# Patient Record
Sex: Male | Born: 1990 | Hispanic: Refuse to answer | Marital: Married | Smoking: Never smoker
Health system: Southern US, Community
[De-identification: ages and names within clinical notes are randomized; demographics above are authoritative.]

## PROBLEM LIST (undated history)

## (undated) DIAGNOSIS — J45909 Unspecified asthma, uncomplicated: Secondary | ICD-10-CM

## (undated) DIAGNOSIS — F419 Anxiety disorder, unspecified: Secondary | ICD-10-CM

## (undated) DIAGNOSIS — T7840XA Allergy, unspecified, initial encounter: Secondary | ICD-10-CM

## (undated) HISTORY — DX: Anxiety disorder, unspecified: F41.9

## (undated) HISTORY — DX: Allergy, unspecified, initial encounter: T78.40XA

## (undated) HISTORY — DX: Unspecified asthma, uncomplicated: J45.909

---
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
## 2014-10-01 HISTORY — PX: WRIST SURGERY: SHX841

## 2017-08-21 ENCOUNTER — Ambulatory Visit (INDEPENDENT_AMBULATORY_CARE_PROVIDER_SITE_OTHER): Payer: Managed Care, Other (non HMO) | Admitting: Family Medicine

## 2017-08-21 ENCOUNTER — Encounter: Payer: Self-pay | Admitting: Family Medicine

## 2017-08-21 ENCOUNTER — Other Ambulatory Visit: Payer: Self-pay

## 2017-08-21 VITALS — BP 112/72 | HR 82 | Temp 98.6°F | Resp 17 | Ht 69.0 in | Wt 181.4 lb

## 2017-08-21 DIAGNOSIS — Z7689 Persons encountering health services in other specified circumstances: Secondary | ICD-10-CM

## 2017-08-21 DIAGNOSIS — Z23 Encounter for immunization: Secondary | ICD-10-CM | POA: Diagnosis not present

## 2017-08-21 DIAGNOSIS — J4520 Mild intermittent asthma, uncomplicated: Secondary | ICD-10-CM

## 2017-08-21 NOTE — Progress Notes (Signed)
  Chief Complaint  Patient presents with  . 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 Allergy   . Anxiety   . Asthma     Current Outpatient Medications  Medication Sig Dispense Refill  . Ascorbic Acid (VITAMIN C) 1000 MG tablet Take 1,000 mg by mouth daily.    . cholecalciferol (VITAMIN D) 1000 units tablet Take 1,000 Units by mouth daily.    . vitamin B-12 (CYANOCOBALAMIN) 1000 MCG tablet Take 1,000 mcg by mouth daily.     No current facility-administered medications for this visit.     Allergies: No Known Allergies  Past Surgical History:  Procedure Laterality Date  . LITHOTRIPSY    . LITHOTRIPSY      Social History   Socioeconomic History  . Marital status: Married    Spouse name: None  . Number of children: None  . Years of education: None  . Highest education level: None  Social Needs  . Financial resource strain: None  . Food insecurity - worry: None  . Food insecurity - inability: None  . Transportation needs - medical: None  . Transportation needs - non-medical: None  Occupational History  . None  Tobacco Use  . Smoking status: Never Smoker  . Smokeless tobacco: Never Used  Substance and Sexual Activity  . Alcohol use: Yes    Alcohol/week: 4.2 - 6.0 oz    Types: 7 - 10 Cans of beer per week  . Drug use: No  . Sexual activity: Yes  Other Topics Concern  . None  Social History Narrative  . None    Family History  Problem Relation Age of Onset  . Heart disease Mother   . Asthma Father       ROS Review of Systems See HPI Constitution: No fevers or chills No malaise No diaphoresis Skin: No rash or itching Eyes: no blurry vision, no double vision GU: no dysuria or hematuria Neuro: no dizziness or headaches  all others reviewed and negative   Objective: Vitals:   08/21/17 1124  Resp: 17  SpO2: 99%  Weight: 181 lb 6.4 oz (82.3 kg)  Height: 5\' 9"  (1.753 m)    Physical Exam  Constitutional: He is oriented to person, place, and time. He appears well-developed and well-nourished.  HENT:  Head: Normocephalic and atraumatic.  Eyes: Conjunctivae and EOM are normal.  Cardiovascular: Normal rate, regular rhythm and normal heart sounds.  Pulmonary/Chest: Effort normal and breath sounds normal. No stridor. No respiratory distress.  Neurological: He is alert and oriented to person, place, and time.    Assessment and Plan Taylor Baker was seen today for new patient (initial visit).  Diagnoses and all orders for this visit:  Encounter to establish care with new doctor  Need for Tdap vaccination -     Tdap vaccine greater than or equal to 7yo IM  Mild intermittent reactive airway disease without complication- stable and asymptomatic     Taylor Baker A Creta LevinStallings

## 2017-08-21 NOTE — Patient Instructions (Addendum)
IF you received an x-ray today, you will receive an invoice from [facility]. Please contact [facility] at [phone] with questions or concerns regarding your invoice.   IF you received labwork today, you will receive an invoice from [facility]. Please contact [facility] at [phone] with questions or concerns regarding your invoice.   Our billing staff will not be able to assist you with questions regarding bills from these companies.  You will be contacted with the lab results as soon as they are available. The fastest way to get your results is to activate your My Chart account. Instructions are located on the last page of this paperwork. If you have not heard from [place] regarding the results in 2 weeks, please contact this office.    Immunization Schedule, Adult Recommended immunizations These include:  Influenza vaccine. ? All adults should be immunized every year. ? All adults, including pregnant women and people with hives-only allergy to eggs can receive the inactivated influenza vaccine (IIV) or recombinant influenza vaccine (RIV). ? Adults aged 18-64 years can receive the IIV or RIV. The RIV vaccine does not contain any egg protein. ? Adults aged 54 years or older can receive the high-dose or adjuvanted IIV.  Tetanus, diphtheria, and acellular pertussis (Td, Tdap) vaccine. ? Pregnant women should receive 1 dose of Tdap vaccine during each pregnancy. The dose should be obtained regardless of the length of time since the last dose. Immunization is preferred during the 27th to 36th week of gestation. ? An adult who has not previously received Tdap or who does not know his or her vaccine status should receive 1 dose of Tdap. This initial dose should be followed by tetanus and diphtheria toxoids (Td) booster doses every 10 years. ? Adults with an unknown or incomplete history of completing a 3-dose immunization series with Td-containing vaccines should begin or  complete a primary immunization series including a Tdap dose. The first 2 doses should be received at least 4 weeks apart, and the third dose 6-12 months after the second dose. ? Adults should receive a Td booster every 10 years.  Varicella vaccine. ? An adult without evidence of immunity to varicella should receive 2 doses 4-8 weeks apart, or a second dose if he or she has previously received 1 dose. ? Pregnant females who do not have evidence of immunity should receive the first dose after pregnancy. This first dose should be obtained before leaving the health care facility. The second dose should be obtained 4-8 weeks after the first dose. ? All healthcare workers should have evidence of immunity to varicella. ? Adults with cancer or those who are on therapy to suppress the immune system should not receive the varicella vaccine.  Human papillomavirus (HPV) vaccine. ? Females aged 13-26 years who have not received the vaccine previously should obtain the 3-dose series. Females should receive the second dose 1-2 months after the first dose, and the third dose 6 months after the first dose. ? The vaccine is not recommended for use in pregnant females. However, pregnancy testing is not needed before receiving a dose. If a male is found to be pregnant after receiving a dose, no treatment is needed. In that case, the remaining doses should be delayed until after the pregnancy. ? Males aged 77-21 years who have not received the vaccine previously should receive the 3-dose series. Males aged 22-26 years may also receive a 3 dose series. Males should receive the second dose 1-2 months after the  first dose, and the third dose 6 months after the first dose. ? Adult females up to age 26 years and adult males up to age 21 years who initiated the HPV vaccine series before age 15 years and received 2 doses at least 5 months apart do not need an additional dose of the vaccine. ? Adult females up to age 26 years  and adult male up to age 21 years who initiated the HPV vaccine series before 15 years but only received 1 dose or 2 doses less than 5 months apart should receive 1 additional dose of the vaccine. ? Immunization is recommended through the age of 26 years for any male who has sex with males and did not get any or all doses earlier. ? Immunization is recommended for any person with an immunocompromised condition through the age of 26 years if he or she did not get any or all doses earlier.  Zoster vaccine. ? One dose is recommended for adults aged 60 years or older unless certain conditions are present.  Measles, mumps, and rubella (MMR) vaccine. ? Adults born before 1957 generally are considered immune to measles and mumps. ? Adults born in 1957 or later should have 1 or more doses of MMR vaccine unless there is a contraindication to the vaccine or there is laboratory evidence of immunity to each of the three diseases. ? A routine second dose of MMR vaccine should be obtained at least 28 days after the first dose for students attending postsecondary schools, health care workers, or international travelers. ? People who received inactivated measles vaccine or an unknown type of measles vaccine during 1963-1967 should be revaccinated with 1 or 2 doses of MMR vaccine. ? People who received inactivated mumps vaccine or an unknown type of mumps vaccine before 1979 and are at high risk for mumps infection should consider immunization with 2 doses of MMR vaccine. ? For females of childbearing age, rubella immunity should be determined. If there is no evidence of immunity, females who are not pregnant should receive 1 dose of MMR. If there is no evidence of immunity, females who are pregnant should receive 1 dose of MMR after pregnancy and before leaving the healthcare facility. ? Unvaccinated health care workers born before 1957 who lack laboratory evidence of measles, mumps, or rubella immunity or laboratory  confirmation of disease should consider 2 doses of MMR 18 days apart for measles or mumps, or 1 dose of MMR for rubella.  Pneumococcal vaccines. ? All adults aged 65 years and older should receive 13-valent pneumococcal conjugate vaccine (PCV13) followed by 23-valent pneumococcal polyscaccharide vaccine (PPSV23) at least 1 year after PCV13. ? An adult aged 19 years or older who has certain medical conditions and has not been previously immunized should receive 1 dose of PCV13 vaccine. This PCV13 should be followed with a dose of pneumococcal polysaccharide (PPSV23) vaccine. The PPSV23 vaccine dose should be obtained at least 8 weeks after the dose of PCV13 vaccine. ? An adult aged 19 years or older who has certain medical conditions and previously received 1 or more doses of PPSV23 vaccine should receive 1 dose of PCV13. The PCV13 vaccine dose should be obtained 1 or more years after the last PPSV23 vaccine dose. ? PPSV23 vaccination should happen in all adults aged 19-64 who smoke cigarettes. ? People with an immunocompromised condition and certain other conditions should receive both PCV13 and PPSV23 vaccines. ? When indicated, people who have unknown immunization and have no record of   immunization should receive PPSV23 vaccine. ? People who received 1-2 doses of PPSV23 before age 65 years should receive another dose of PPSV23 vaccine at age 65 years or later if at least 5 years have passed since the previous dose. ? Doses of PPSV23 are not needed for people immunized with PPSV23 at or after age 65 years.  Meningococcal vaccine. ? Adults with asplenia or persistent complement component deficiencies should receive 2 doses of quadrivalent meningococcal conjugate (MenACWY-D) vaccine. The doses should be obtained at least 2 months apart. Revaccination should occur every 5 years. A 2-dose or 3-dose series of serogroup B meningococcal (MenB)vaccine should also be obtained. ? Microbiologists working with  certain meningococcal bacteria, military recruits, people at risk during an outbreak, and people who travel to or live in countries with a high rate of meningitis should be immunized. Revaccination is recommended every 5 years if the risk for infection remains. ? Adults with HIV infection who have not been previously vaccinated should receive a 2-dose MenACWY series with doses at least 2 months apart. If 1 dose was received previously, a second dose should be obtained at least 2 months later. Revaccination is recommended every 5 years. ? A first-year college student up through age 21 years who is living in a residence hall should receive a dose if he or she did not receive a dose on or after his or her 16th birthday. ? Adults aged 16-23 years may receive 2 doses of MenB vaccine for short-term protection against most strains of serogroup B meningococcal disease.  Hepatitis A vaccine. ? Adults who wish to be protected from this disease, have certain high-risk conditions, work with hepatitis A-infected animals, work in hepatitis A research labs, or travel to or work in countries with a high rate of hepatitis A should be immunized. ? Adults who were previously unvaccinated and who anticipate close contact with an international adoptee during the first 60 days after arrival in the [place] from a country with a high rate of hepatitis A should be immunized. ? The vaccine may be given as a 2 or 3-dose series by itself or in combination with the hepatitis B vaccine (HepB).  Hepatitis B vaccine. ? Adults who wish to be protected from this disease, may be exposed to blood or other infectious body fluids, are household contacts or sex partners of hepatitis B positive people, are clients or workers in certain care facilities, or travel to or work in countries with a high rate of hepatitis B should be immunized. ? Adults with chronic liver disease, such as hepatitis C infection, cirrhosis, fatty liver disease,  alcoholic liver disease, autoimmune hepatitis, and elevated liver chemistry levels should be vaccinated. ? Pregnant women who are at risk for hepatitis B virus infection during pregnancy should be immunized. ? The vaccine may be given as a 3-dose series by itself or in combination with the hepatitis A vaccine (HepA).  Haemophilus influenzae type b (Hib) vaccine. ? A previously unvaccinated person with asplenia or sickle cell disease or having a scheduled splenectomy should receive 1 dose of Hib vaccine. This should happen at least 14 days before the procedure. ? Regardless of previous immunization, a recipient of a hematopoietic stem cell transplant should receive a 3-dose series, with at least 4 weeks between doses, 6-12 months after his or her successful transplant. ? Hib vaccine is not recommended for adults with HIV infection.  This information is not intended to replace advice given to you by your health care   provider. Make sure you discuss any questions you have with your health care provider. Document Released: 12/08/2003 Document Revised: 05/30/2016 Document Reviewed: 05/30/2016 Elsevier Interactive Patient Education  2018 [employer].

## 2017-08-22 ENCOUNTER — Encounter: Payer: Self-pay | Admitting: Family Medicine

## 2017-08-29 ENCOUNTER — Encounter: Payer: Self-pay | Admitting: Family Medicine

## 2017-08-29 ENCOUNTER — Ambulatory Visit (INDEPENDENT_AMBULATORY_CARE_PROVIDER_SITE_OTHER): Payer: Managed Care, Other (non HMO) | Admitting: Family Medicine

## 2017-08-29 ENCOUNTER — Other Ambulatory Visit: Payer: Self-pay

## 2017-08-29 VITALS — BP 110/62 | HR 87 | Temp 98.3°F | Resp 17 | Ht 69.0 in | Wt 182.8 lb

## 2017-08-29 DIAGNOSIS — Z13220 Encounter for screening for lipoid disorders: Secondary | ICD-10-CM

## 2017-08-29 DIAGNOSIS — Z0000 Encounter for general adult medical examination without abnormal findings: Secondary | ICD-10-CM | POA: Diagnosis not present

## 2017-08-29 NOTE — Patient Instructions (Addendum)
   IF you received an x-ray today, you will receive an invoice from [facility]. Please contact [facility] at [phone] with questions or concerns regarding your invoice.   IF you received labwork today, you will receive an invoice from [facility]. Please contact [facility] at [phone] with questions or concerns regarding your invoice.   Our billing staff will not be able to assist you with questions regarding bills from these companies.  You will be contacted with the lab results as soon as they are available. The fastest way to get your results is to activate your My Chart account. Instructions are located on the last page of this paperwork. If you have not heard from us regarding the results in 2 weeks, please contact this office.      Health Maintenance, Male A healthy lifestyle and preventive care is important for your health and wellness. Ask your health care provider about what schedule of regular examinations is right for you. What should I know about weight and diet? Eat a Healthy Diet  Eat plenty of vegetables, fruits, whole grains, low-fat dairy products, and lean protein.  Do not eat a lot of foods high in solid fats, added sugars, or salt.  Maintain a Healthy Weight Regular exercise can help you achieve or maintain a healthy weight. You should:  Do at least 150 minutes of exercise each week. The exercise should increase your heart rate and make you sweat (moderate-intensity exercise).  Do strength-training exercises at least twice a week.  Watch Your Levels of Cholesterol and Blood Lipids  Have your blood tested for lipids and cholesterol every 5 years starting at 28 years of age. If you are at high risk for heart disease, you should start having your blood tested when you are 28 years old. You may need to have your cholesterol levels checked more often if: ? Your lipid or cholesterol levels are high. ? You are older than 28 years of age. ? You  are at high risk for heart disease.  What should I know about cancer screening? Many types of cancers can be detected early and may often be prevented. Lung Cancer  You should be screened every year for lung cancer if: ? You are a current smoker who has smoked for at least 30 years. ? You are a former smoker who has quit within the past 15 years.  Talk to your health care provider about your screening options, when you should start screening, and how often you should be screened.  Colorectal Cancer  Routine colorectal cancer screening usually begins at 28 years of age and should be repeated every 5-10 years until you are 28 years old. You may need to be screened more often if early forms of precancerous polyps or small growths are found. Your health care provider may recommend screening at an earlier age if you have risk factors for colon cancer.  Your health care provider may recommend using home test kits to check for hidden blood in the stool.  A small camera at the end of a tube can be used to examine your colon (sigmoidoscopy or colonoscopy). This checks for the earliest forms of colorectal cancer.  Prostate and Testicular Cancer  Depending on your age and overall health, your health care provider may do certain tests to screen for prostate and testicular cancer.  Talk to your health care provider about any symptoms or concerns you have about testicular or prostate cancer.  Skin Cancer  Check your skin   from head to toe regularly.  Tell your health care provider about any new moles or changes in moles, especially if: ? There is a change in a mole's size, shape, or color. ? You have a mole that is larger than a pencil eraser.  Always use sunscreen. Apply sunscreen liberally and repeat throughout the day.  Protect yourself by wearing long sleeves, pants, a wide-brimmed hat, and sunglasses when outside.  What should I know about heart disease, diabetes, and high blood  pressure?  If you are 18-39 years of age, have your blood pressure checked every 3-5 years. If you are 40 years of age or older, have your blood pressure checked every year. You should have your blood pressure measured twice-once when you are at a hospital or clinic, and once when you are not at a hospital or clinic. Record the average of the two measurements. To check your blood pressure when you are not at a hospital or clinic, you can use: ? An automated blood pressure machine at a pharmacy. ? A home blood pressure monitor.  Talk to your health care provider about your target blood pressure.  If you are between 45-79 years old, ask your health care provider if you should take aspirin to prevent heart disease.  Have regular diabetes screenings by checking your fasting blood sugar level. ? If you are at a normal weight and have a low risk for diabetes, have this test once every three years after the age of 45. ? If you are overweight and have a high risk for diabetes, consider being tested at a younger age or more often.  A one-time screening for abdominal aortic aneurysm (AAA) by ultrasound is recommended for men aged 65-75 years who are current or former smokers. What should I know about preventing infection? Hepatitis B If you have a higher risk for hepatitis B, you should be screened for this virus. Talk with your health care provider to find out if you are at risk for hepatitis B infection. Hepatitis C Blood testing is recommended for:  Everyone born from 1945 through 1965.  Anyone with known risk factors for hepatitis C.  Sexually Transmitted Diseases (STDs)  You should be screened each year for STDs including gonorrhea and chlamydia if: ? You are sexually active and are younger than 28 years of age. ? You are older than 28 years of age and your health care provider tells you that you are at risk for this type of infection. ? Your sexual activity has changed since you were last  screened and you are at an increased risk for chlamydia or gonorrhea. Ask your health care provider if you are at risk.  Talk with your health care provider about whether you are at high risk of being infected with HIV. Your health care provider may recommend a prescription medicine to help prevent HIV infection.  What else can I do?  Schedule regular health, dental, and eye exams.  Stay current with your vaccines (immunizations).  Do not use any tobacco products, such as cigarettes, chewing tobacco, and e-cigarettes. If you need help quitting, ask your health care provider.  Limit alcohol intake to no more than 2 drinks per day. One drink equals 12 ounces of beer, 5 ounces of wine, or 1 ounces of hard liquor.  Do not use street drugs.  Do not share needles.  Ask your health care provider for help if you need support or information about quitting drugs.  Tell your health care   provider if you often feel depressed.  Tell your health care provider if you have ever been abused or do not feel safe at home. This information is not intended to replace advice given to you by your health care provider. Make sure you discuss any questions you have with your health care provider. Document Released: 03/15/2008 Document Revised: 05/16/2016 Document Reviewed: 06/21/2015 Elsevier Interactive Patient Education  2018 [employer]  

## 2017-08-29 NOTE — Progress Notes (Signed)
Chief Complaint  Patient presents with  . Annual Exam    complete physical exam    Subjective:  Taylor Baker is a 26 y.o. male here for a health maintenance visit.  Patient is established pt  There are no active problems to display for this patient.   Past Medical History:  Diagnosis Date  . Allergy   . Anxiety   . Asthma     Past Surgical History:  Procedure Laterality Date  . LITHOTRIPSY    . LITHOTRIPSY       Outpatient Medications Prior to Visit  Medication Sig Dispense Refill  . Ascorbic Acid (VITAMIN C) 1000 MG tablet Take 1,000 mg by mouth daily.    . cholecalciferol (VITAMIN D) 1000 units tablet Take 1,000 Units by mouth daily.    . vitamin B-12 (CYANOCOBALAMIN) 1000 MCG tablet Take 1,000 mcg by mouth daily.     No facility-administered medications prior to visit.     No Known Allergies   Family History  Problem Relation Age of Onset  . Heart disease Mother   . Asthma Father      Health Habits: Dental Exam: up to date Eye Exam: up to date Exercise: 0 times/week on average Current exercise activities: walking/running Diet: good  Social History   Socioeconomic History  . Marital status: Married    Spouse name: Not on file  . Number of children: Not on file  . Years of education: Not on file  . Highest education level: Not on file  Social Needs  . Financial resource strain: Not hard at all  . Food insecurity - worry: Never true  . Food insecurity - inability: Never true  . Transportation needs - medical: No  . Transportation needs - non-medical: No  Occupational History  . Not on file  Tobacco Use  . Smoking status: Never Smoker  . Smokeless tobacco: Never Used  Substance and Sexual Activity  . Alcohol use: Yes    Alcohol/week: 4.2 - 6.0 oz    Types: 7 - 10 Cans of beer per week  . Drug use: No  . Sexual activity: Yes  Other Topics Concern  . Not on file  Social History Narrative  . Not on file   Social History    Substance and Sexual Activity  Alcohol Use Yes  . Alcohol/week: 4.2 - 6.0 oz  . Types: 7 - 10 Cans of beer per week   Social History   Tobacco Use  Smoking Status Never Smoker  Smokeless Tobacco Never Used   Social History   Substance and Sexual Activity  Drug Use No    Sexual Health  Sexually active: with male partner   Health Maintenance: See under health Maintenance activity for review of completion dates as well. Immunization History  Administered Date(s) Administered  . Tdap 08/21/2017      Depression Screen-PHQ2/9 Depression screen [facility] 2/[facility] 08/29/2017 08/21/2017  Decreased Interest 0 0  Down, Depressed, Hopeless 0 0  PHQ - 2 Score 0 0     Depression Severity and Treatment Recommendations:  0-4= None  5-9= Mild / Treatment: Support, educate to call if worse; return in one month  10-14= Moderate / Treatment: Support, watchful waiting; Antidepressant or Psycotherapy  15-19= Moderately severe / Treatment: Antidepressant OR Psychotherapy  >= 20 = Major depression, severe / Antidepressant AND Psychotherapy    Review of Systems   Review of Systems  Constitutional: Negative for chills, fever and weight loss.  Eyes: Negative for blurred  vision and double vision.  Respiratory: Negative for cough, shortness of breath and wheezing.   Cardiovascular: Negative for chest pain and palpitations.  Gastrointestinal: Negative for abdominal pain, nausea and vomiting.  Genitourinary: Negative for dysuria and urgency.  Skin: Negative for itching and rash.  Neurological: Negative for dizziness, tingling and headaches.  Psychiatric/Behavioral: Negative for depression. The patient is not nervous/anxious.     See HPI for ROS as well.    Objective:   Vitals:   08/29/17 0854  BP: 110/62  Pulse: 87  Resp: 17  Temp: 98.3 F (36.8 C)  TempSrc: Oral  SpO2: 99%  Weight: 182 lb 12.8 oz (82.9 kg)  Height: 5\' 9"  (1.753 m)    Body mass index is 26.99  kg/m.  Physical Exam  Constitutional: He is oriented to person, place, and time. He appears well-developed and well-nourished.  HENT:  Head: Normocephalic and atraumatic.  Right Ear: External ear normal.  Left Ear: External ear normal.  Nose: Nose normal.  Mouth/Throat: Oropharynx is clear and moist.  Eyes: Conjunctivae and EOM are normal. Right eye exhibits no discharge. Left eye exhibits no discharge.  Neck: Normal range of motion. Neck supple.  Cardiovascular: Normal rate, regular rhythm and normal heart sounds.  No murmur heard. Pulmonary/Chest: Effort normal. No respiratory distress. He has no wheezes.  Abdominal: Soft. Bowel sounds are normal. He exhibits no distension. There is no tenderness. There is no rebound and no guarding.  Musculoskeletal: Normal range of motion. He exhibits no edema.  Neurological: He is alert and oriented to person, place, and time. He has normal reflexes.  Skin: Skin is warm. No erythema.  Psychiatric: He has a normal mood and affect. His behavior is normal. Judgment and thought content normal.       Assessment/Plan:   Patient was seen for a health maintenance exam.  Counseled the patient on health maintenance issues. Reviewed her health mainteance schedule and ordered appropriate tests (see orders.) Counseled on regular exercise and weight management. Recommend regular eye exams and dental cleaning.   The following issues were addressed today for health maintenance:   Taylor Baker was seen today for annual exam.  Diagnoses and all orders for this visit:  Encounter for health maintenance examination in adult- discussed age appropriate  -     Lipid panel -     TSH -     Comprehensive metabolic panel -     Hemoglobin A1c  Screening, lipid- pt fasting     No Follow-up on file.    Body mass index is 26.99 kg/m.:  Discussed the patient's BMI with patient. The BMI body mass index is 26.99 kg/m.     No future appointments.  Patient  Instructions       IF you received an x-ray today, you will receive an invoice from [facility]. Please contact [facility] at [phone] with questions or concerns regarding your invoice.   IF you received labwork today, you will receive an invoice from [facility]. Please contact [facility] at [phone] with questions or concerns regarding your invoice.   Our billing staff will not be able to assist you with questions regarding bills from these companies.  You will be contacted with the lab results as soon as they are available. The fastest way to get your results is to activate your My Chart account. Instructions are located on the last page of this paperwork. If you have not heard from us regarding the results in 2 weeks, please contact this office.

## 2017-08-30 ENCOUNTER — Encounter: Payer: Self-pay | Admitting: Family Medicine

## 2017-08-30 LAB — LIPID PANEL
CHOLESTEROL TOTAL: 88 mg/dL — AB (ref 100–199)
Chol/HDL Ratio: 1.5 ratio (ref 0.0–5.0)
HDL: 58 mg/dL (ref >39)
LDL Calculated: 27 mg/dL (ref 0–99)
Triglycerides: 16 mg/dL (ref 0–149)
VLDL Cholesterol Cal: 3 mg/dL — ABNORMAL LOW (ref 5–40)

## 2017-08-30 LAB — COMPREHENSIVE METABOLIC PANEL
ALT: 23 IU/L (ref 0–44)
AST: 23 IU/L (ref 0–40)
Albumin/Globulin Ratio: 1.9 (ref 1.2–2.2)
Albumin: 5.2 g/dL (ref 3.5–5.5)
Alkaline Phosphatase: 64 IU/L (ref 39–117)
BILIRUBIN TOTAL: 0.8 mg/dL (ref 0.0–1.2)
BUN/Creatinine Ratio: 11 (ref 9–20)
BUN: 10 mg/dL (ref 6–20)
CHLORIDE: 100 mmol/L (ref 96–106)
CO2: 23 mmol/L (ref 20–29)
Calcium: 10.4 mg/dL — ABNORMAL HIGH (ref 8.7–10.2)
Creatinine, Ser: 0.92 mg/dL (ref 0.76–1.27)
GFR calc non Af Amer: 113 mL/min/{1.73_m2} (ref >59)
GFR, EST AFRICAN AMERICAN: 130 mL/min/{1.73_m2} (ref >59)
GLUCOSE: 100 mg/dL — AB (ref 65–99)
Globulin, Total: 2.7 g/dL (ref 1.5–4.5)
Potassium: 4 mmol/L (ref 3.5–5.2)
Sodium: 141 mmol/L (ref 134–144)
TOTAL PROTEIN: 7.9 g/dL (ref 6.0–8.5)

## 2017-08-30 LAB — TSH: TSH: 0.965 u[IU]/mL (ref 0.450–4.500)

## 2017-08-30 LAB — HEMOGLOBIN A1C
ESTIMATED AVERAGE GLUCOSE: 100 mg/dL
HEMOGLOBIN A1C: 5.1 % (ref 4.8–5.6)

## 2017-10-29 ENCOUNTER — Other Ambulatory Visit: Payer: Self-pay

## 2017-10-29 ENCOUNTER — Ambulatory Visit (INDEPENDENT_AMBULATORY_CARE_PROVIDER_SITE_OTHER): Payer: Managed Care, Other (non HMO) | Admitting: Family Medicine

## 2017-10-29 ENCOUNTER — Encounter: Payer: Self-pay | Admitting: Family Medicine

## 2017-10-29 VITALS — BP 122/78 | HR 68 | Temp 98.1°F | Resp 16 | Ht 69.29 in | Wt 192.0 lb

## 2017-10-29 DIAGNOSIS — J0100 Acute maxillary sinusitis, unspecified: Secondary | ICD-10-CM

## 2017-10-29 MED ORDER — IPRATROPIUM BROMIDE 0.03 % NA SOLN: 2.0000 | Freq: Four times a day (QID) | NASAL | 1 refills | Status: DC

## 2017-10-29 MED ORDER — AMOXICILLIN 500 MG PO CAPS: 1000.0000 mg | ORAL_CAPSULE | Freq: Two times a day (BID) | ORAL | 0 refills | Status: DC

## 2017-10-29 MED ORDER — PREDNISONE 20 MG PO TABS: ORAL_TABLET | ORAL | 0 refills | Status: DC

## 2017-10-29 NOTE — Patient Instructions (Addendum)
   IF you received an x-ray today, you will receive an invoice from [facility]. Please contact [facility] at [phone] with questions or concerns regarding your invoice.   IF you received labwork today, you will receive an invoice from [facility]. Please contact [facility] at [phone] with questions or concerns regarding your invoice.   Our billing staff will not be able to assist you with questions regarding bills from these companies.  You will be contacted with the lab results as soon as they are available. The fastest way to get your results is to activate your My Chart account. Instructions are located on the last page of this paperwork. If you have not heard from us regarding the results in 2 weeks, please contact this office.      Sinusitis, Adult Sinusitis is soreness and inflammation of your sinuses. Sinuses are hollow spaces in the bones around your face. Your sinuses are located:  Around your eyes.  In the middle of your forehead.  Behind your nose.  In your cheekbones.  Your sinuses and nasal passages are lined with a stringy fluid (mucus). Mucus normally drains out of your sinuses. When your nasal tissues become inflamed or swollen, the mucus can become trapped or blocked so air cannot flow through your sinuses. This allows bacteria, viruses, and funguses to grow, which leads to infection. Sinusitis can develop quickly and last for 7?10 days (acute) or for more than 12 weeks (chronic). Sinusitis often develops after a cold. What are the causes? This condition is caused by anything that creates swelling in the sinuses or stops mucus from draining, including:  Allergies.  Asthma.  Bacterial or viral infection.  Abnormally shaped bones between the nasal passages.  Nasal growths that contain mucus (nasal polyps).  Narrow sinus openings.  Pollutants, such as chemicals or irritants in the air.  A foreign object stuck in the nose.  A fungal  infection. This is rare.  What increases the risk? The following factors may make you more likely to develop this condition:  Having allergies or asthma.  Having had a recent cold or respiratory tract infection.  Having structural deformities or blockages in your nose or sinuses.  Having a weak immune system.  Doing a lot of swimming or diving.  Overusing nasal sprays.  Smoking.  What are the signs or symptoms? The main symptoms of this condition are pain and a feeling of pressure around the affected sinuses. Other symptoms include:  Upper toothache.  Earache.  Headache.  Bad breath.  Decreased sense of smell and taste.  A cough that may get worse at night.  Fatigue.  Fever.  Thick drainage from your nose. The drainage is often green and it may contain pus (purulent).  Stuffy nose or congestion.  Postnasal drip. This is when extra mucus collects in the throat or back of the nose.  Swelling and warmth over the affected sinuses.  Sore throat.  Sensitivity to light.  How is this diagnosed? This condition is diagnosed based on symptoms, a medical history, and a physical exam. To find out if your condition is acute or chronic, your health care provider may:  Look in your nose for signs of nasal polyps.  Tap over the affected sinus to check for signs of infection.  View the inside of your sinuses using an imaging device that has a light attached (endoscope).  If your health care provider suspects that you have chronic sinusitis, you may also:  Be tested for allergies.    Have a sample of mucus taken from your nose (nasal culture) and checked for bacteria.  Have a mucus sample examined to see if your sinusitis is related to an allergy.  If your sinusitis does not respond to treatment and it lasts longer than 8 weeks, you may have an MRI or CT scan to check your sinuses. These scans also help to determine how severe your infection is. In rare cases, a bone  biopsy may be done to rule out more serious types of fungal sinus disease. How is this treated? Treatment for sinusitis depends on the cause and whether your condition is chronic or acute. If a virus is causing your sinusitis, your symptoms will go away on their own within 10 days. You may be given medicines to relieve your symptoms, including:  Topical nasal decongestants. They shrink swollen nasal passages and let mucus drain from your sinuses.  Antihistamines. These drugs block inflammation that is triggered by allergies. This can help to ease swelling in your nose and sinuses.  Topical nasal corticosteroids. These are nasal sprays that ease inflammation and swelling in your nose and sinuses.  Nasal saline washes. These rinses can help to get rid of thick mucus in your nose.  If your condition is caused by bacteria, you will be given an antibiotic medicine. If your condition is caused by a fungus, you will be given an antifungal medicine. Surgery may be needed to correct underlying conditions, such as narrow nasal passages. Surgery may also be needed to remove polyps. Follow these instructions at home: Medicines  Take, use, or apply over-the-counter and prescription medicines only as told by your health care provider. These may include nasal sprays.  If you were prescribed an antibiotic medicine, take it as told by your health care provider. Do not stop taking the antibiotic even if you start to feel better. Hydrate and Humidify  Drink enough water to keep your urine clear or pale yellow. Staying hydrated will help to thin your mucus.  Use a cool mist humidifier to keep the humidity level in your home above 50%.  Inhale steam for 10-15 minutes, 3-4 times a day or as told by your health care provider. You can do this in the bathroom while a hot shower is running.  Limit your exposure to cool or dry air. Rest  Rest as much as possible.  Sleep with your head raised  (elevated).  Make sure to get enough sleep each night. General instructions  Apply a warm, moist washcloth to your face 3-4 times a day or as told by your health care provider. This will help with discomfort.  Wash your hands often with soap and water to reduce your exposure to viruses and other germs. If soap and water are not available, use hand sanitizer.  Do not smoke. Avoid being around people who are smoking (secondhand smoke).  Keep all follow-up visits as told by your health care provider. This is important. Contact a health care provider if:  You have a fever.  Your symptoms get worse.  Your symptoms do not improve within 10 days. Get help right away if:  You have a severe headache.  You have persistent vomiting.  You have pain or swelling around your face or eyes.  You have vision problems.  You develop confusion.  Your neck is stiff.  You have trouble breathing. This information is not intended to replace advice given to you by your health care provider. Make sure you discuss any questions you   have with your health care provider. Document Released: 09/17/2005 Document Revised: 05/13/2016 Document Reviewed: 07/13/2015 Elsevier Interactive Patient Education  2018 Elsevier Inc.  

## 2017-10-29 NOTE — Progress Notes (Signed)
Subjective:  By signing my name below, I, Essence Howell, attest that this documentation has been prepared under the direction and in the presence of Norberto SorensonEva Jamai Dolce, MD Electronically Signed: Charline BillsEssence Howell, ED Scribe 10/29/2017 at 1:43 PM.   Patient ID: Taylor Baker, male    DOB: 09/24/1991, 27 y.o.   MRN: [id number]  Chief Complaint  Patient presents with  . Sinus Problem    pt states he has been having head pressure  . Nasal Congestion    pt states the congestion has been since Sunday with some drainage    HPI Taylor Buffyhomas Tyler Dignan is a 27 y.o. male who presents to [facility] at [facility] complaining of scratchy throat and HA x 2 days ago. Pt reports gradually worsening throat irritation today which he describes as a "raw" sensation, nasal congestion and sinus pressure onset yesterday. He has tried DayQuil without relief. Denies fever, chills, generalized myalgias, nausea, vomiting. No known drug allergies. Has not been on antibiotics within the past few months.  There are no active problems to display for this patient.  Past Medical History:  Diagnosis Date  . Allergy   . Anxiety   . Asthma    Past Surgical History:  Procedure Laterality Date  . LITHOTRIPSY    . LITHOTRIPSY     No Known Allergies Prior to Admission medications   Medication Sig Start Date End Date Taking? Authorizing Provider  Ascorbic Acid (VITAMIN C) 1000 MG tablet Take 1,000 mg by mouth daily.    [provider]  cholecalciferol (VITAMIN D) 1000 units tablet Take 1,000 Units by mouth daily.    [provider]  vitamin B-12 (CYANOCOBALAMIN) 1000 MCG tablet Take 1,000 mcg by mouth daily.    [provider]   Social History   Socioeconomic History  . Marital status: Married    Spouse name: Not on file  . Number of children: Not on file  . Years of education: Not on file  . Highest education level: Not on file  Social Needs  . Financial resource strain: Not hard at all  .  Food insecurity - worry: Never true  . Food insecurity - inability: Never true  . Transportation needs - medical: No  . Transportation needs - non-medical: No  Occupational History  . Not on file  Tobacco Use  . Smoking status: Never Smoker  . Smokeless tobacco: Never Used  Substance and Sexual Activity  . Alcohol use: Yes    Alcohol/week: 4.2 - 6.0 oz    Types: 7 - 10 Cans of beer per week  . Drug use: No  . Sexual activity: Yes  Other Topics Concern  . Not on file  Social History Narrative  . Not on file   Depression screen [facility] 2/9 10/29/2017 08/29/2017 08/21/2017  Decreased Interest 0 0 0  Down, Depressed, Hopeless 0 0 0  PHQ - 2 Score 0 0 0    Review of Systems  Constitutional: Negative for chills and fever.  HENT: Positive for congestion, sinus pressure and sore throat.   Gastrointestinal: Negative for nausea and vomiting.  Musculoskeletal: Negative for myalgias.  Neurological: Positive for headaches.      Objective:   Physical Exam  Constitutional: He is oriented to person, place, and time. He appears well-developed and well-nourished. No distress.  HENT:  Head: Normocephalic and atraumatic.  Left Ear: Tympanic membrane is injected.  Mouth/Throat: Posterior oropharyngeal erythema (and clear postnasal drip) present.  L nare: rhinitis and edema  Eyes: Conjunctivae and EOM are normal.  Neck: Neck supple. No tracheal deviation present.  Cardiovascular: Normal rate and regular rhythm.  Pulmonary/Chest: Effort normal and breath sounds normal. No respiratory distress.  Musculoskeletal: Normal range of motion.  Lymphadenopathy:       Head (left side): Submandibular and tonsillar adenopathy present.    He has cervical adenopathy (on the L).  Neurological: He is alert and oriented to person, place, and time.  Skin: Skin is warm and dry.  Psychiatric: He has a normal mood and affect. His behavior is normal.  Nursing note and vitals reviewed.   BP 122/78   Pulse 68    Temp 98.1 F (36.7 C) (Oral)   Resp 16   Ht 5' 9.29" (1.76 m)   Wt 192 lb (87.1 kg)   SpO2 96%   BMI 28.12 kg/m     Assessment & Plan:   1. Acute non-recurrent maxillary sinusitis   Has several job interviews and a flight to [place] within the next few days so will add prednisone in to trx regiment with amox in hopes of expediting symptomatic improvement.  Meds ordered this encounter  Medications  . amoxicillin (AMOXIL) 500 MG capsule    Sig: Take 2 capsules (1,000 mg total) by mouth 2 (two) times daily.    Dispense:  40 capsule    Refill:  0  . ipratropium (ATROVENT) 0.03 % nasal spray    Sig: Place 2 sprays into the nose 4 (four) times daily.    Dispense:  30 mL    Refill:  1  . predniSONE (DELTASONE) 20 MG tablet    Sig: Take 3 tabs qd x 2d, then 2 tabs qd x 2d then 1 tab qd x 2d.    Dispense:  12 tablet    Refill:  0    I personally performed the services described in this documentation, which was scribed in my presence. The recorded information has been reviewed and considered, and addended by me as needed.   Norberto Sorenson, M.D.  Primary Care at [facility] [address] [place], [place] [address] [phone] phone [phone] fax  10/31/17 2:46 AM

## 2018-03-26 ENCOUNTER — Ambulatory Visit (INDEPENDENT_AMBULATORY_CARE_PROVIDER_SITE_OTHER): Payer: Managed Care, Other (non HMO) | Admitting: Physician Assistant

## 2018-03-26 ENCOUNTER — Other Ambulatory Visit: Payer: Self-pay

## 2018-03-26 ENCOUNTER — Encounter: Payer: Self-pay | Admitting: Physician Assistant

## 2018-03-26 VITALS — BP 116/84 | HR 67 | Temp 98.4°F | Ht 69.0 in | Wt 191.6 lb

## 2018-03-26 DIAGNOSIS — M25561 Pain in right knee: Secondary | ICD-10-CM | POA: Diagnosis not present

## 2018-03-26 DIAGNOSIS — M25562 Pain in left knee: Secondary | ICD-10-CM | POA: Diagnosis not present

## 2018-03-26 DIAGNOSIS — M7652 Patellar tendinitis, left knee: Secondary | ICD-10-CM | POA: Diagnosis not present

## 2018-03-26 DIAGNOSIS — M7651 Patellar tendinitis, right knee: Secondary | ICD-10-CM | POA: Diagnosis not present

## 2018-03-26 MED ORDER — MELOXICAM 7.5 MG PO TABS: 7.5000 mg | ORAL_TABLET | Freq: Every day | ORAL | 1 refills | Status: DC

## 2018-03-26 NOTE — Patient Instructions (Addendum)
  Meloxicam is an NSAID. Do not use with any other otc pain medication other than tylenol/acetaminophen - so no aleve, ibuprofen, motrin, advil, etc. You may take '15mg'$ /day -- you can take one pill in the morning and one at night if needed.   You will receive a phone call to schedule an appointment with sports medicine.   Please see attached sheets for patellar tendonitis info.   Thank you for coming in today. I hope you feel we met your needs.  Feel free to call PCP if you have any questions or further requests.  Please consider signing up for MyChart if you do not already have it, as this is a great way to communicate with me.  Best,  Whitney McVey, PA-C  IF you received an x-ray today, you will receive an invoice from [facility]. Please contact [facility] at [phone] with questions or concerns regarding your invoice.   IF you received labwork today, you will receive an invoice from [facility]. Please contact [facility] at [phone] with questions or concerns regarding your invoice.   Our billing staff will not be able to assist you with questions regarding bills from these companies.  You will be contacted with the lab results as soon as they are available. The fastest way to get your results is to activate your My Chart account. Instructions are located on the last page of this paperwork. If you have not heard from [place] regarding the results in 2 weeks, please contact this office.

## 2018-03-26 NOTE — Progress Notes (Signed)
   Taylor Baker  MRN: [id number] DOB: [birth year]  PCP: Patient, No Pcp Per  Subjective:  Pt is a 27 year old male who presents to clinic for knee pain x 1 week. Pain is of both knees, R>L.   He has been playing summer league basketball. This is the first time he's played sports in about 8 years.  Pain is worse every day. "today is horrible" hurt without ambulation. Pain is of the font of his knees.  Denies instability.  He has iced and wrapped knees.  No h/o knee pain.   Review of Systems  Constitutional: Negative for chills and fever.  Musculoskeletal: Positive for arthralgias (b/l knees). Negative for gait problem and joint swelling.    There are no active problems to display for this patient.   Current Outpatient Medications on File Prior to Visit  Medication Sig Dispense Refill  . Ascorbic Acid (VITAMIN C) 1000 MG tablet Take 1,000 mg by mouth daily.    . cholecalciferol (VITAMIN D) 1000 units tablet Take 1,000 Units by mouth daily.    . vitamin B-12 (CYANOCOBALAMIN) 1000 MCG tablet Take 1,000 mcg by mouth daily.    Marland Kitchen. ipratropium (ATROVENT) 0.03 % nasal spray Place 2 sprays into the nose 4 (four) times daily. (Patient not taking: Reported on 03/26/2018) 30 mL 1   No current facility-administered medications on file prior to visit.     No Known Allergies   Objective:  BP 116/84 (BP Location: Left Arm, Patient Position: Sitting, Cuff Size: Normal)   Pulse 67   Temp 98.4 F (36.9 C) (Oral)   Ht 5\' 9"  (1.753 m)   Wt 191 lb 9.6 oz (86.9 kg)   SpO2 99%   BMI 28.29 kg/m   Physical Exam  Constitutional: He is oriented to person, place, and time. He appears well-developed and well-nourished.  Musculoskeletal:       Right knee: He exhibits normal range of motion, no swelling, no effusion, normal alignment and normal patellar mobility. Tenderness found. Patellar tendon tenderness noted.       Left knee: He exhibits normal range of motion, no swelling, no effusion,  normal alignment and normal patellar mobility. Tenderness found. Patellar tendon tenderness noted.  Neurological: He is alert and oriented to person, place, and time.  Skin: Skin is warm and dry.  Psychiatric: He has a normal mood and affect. His behavior is normal. Judgment and thought content normal.  Vitals reviewed.   Assessment and Plan :  1. Patellar tendinitis of both knees 2. Acute pain of both knees - Pt presents c/o b/l knee pain after starting to play basketball for the first time in >5 years. Suspect b/l patellar tendonitis. B/l patellar straps placed on pt with moderate relief. Plan to refer to sports med for evaluation. Pt understands and agrees with plan.  - Ambulatory referral to Sports Medicine - meloxicam (MOBIC) 7.5 MG tablet; Take 1-2 tablets (7.5-15 mg total) by mouth daily.  Dispense: 30 tablet; Refill: 1  Whitney Deetra Booton, PA-C  Primary [facility] at [facility] 03/26/2018 4:19 PM

## 2018-03-27 ENCOUNTER — Ambulatory Visit (INDEPENDENT_AMBULATORY_CARE_PROVIDER_SITE_OTHER): Payer: Managed Care, Other (non HMO) | Admitting: Family Medicine

## 2018-03-27 ENCOUNTER — Encounter: Payer: Self-pay | Admitting: Family Medicine

## 2018-03-27 VITALS — BP 138/74 | HR 98 | Ht 69.0 in | Wt 197.0 lb

## 2018-03-27 DIAGNOSIS — M25561 Pain in right knee: Secondary | ICD-10-CM | POA: Diagnosis not present

## 2018-03-27 DIAGNOSIS — M25562 Pain in left knee: Secondary | ICD-10-CM

## 2018-03-27 NOTE — Patient Instructions (Signed)
Nice to meet you  Please try to ice the area  Please take the mobic 7 days straight and then as needed  Please try the exercises  Please try the straps if you're running  Please follow up in 2-3 weeks.

## 2018-03-27 NOTE — Progress Notes (Signed)
Taylor Baker - 27 y.o. male MRN [id number]  Date of birth: 1991-01-13  SUBJECTIVE:  Including CC & ROS.  Chief Complaint  Patient presents with  . Knee Pain    Taylor Baker is a 27 y.o. male that is presenting with bilateral knee pain. Ongoing for one week. Left is worse than right. Pain is located on the inferior aspect of his patella. Pain is worse with flexion and extension. Walking up stairs triggers mild to severe pain. Admits to swelling and tenderness. He was playing basketball one week ago and noticed the pain.  He started wearing a knee brace yesterday. Denies surgeries.    Review of Systems  Constitutional: Negative for fever.  HENT: Negative for congestion.   Respiratory: Negative for cough.   Cardiovascular: Negative for chest pain.  Gastrointestinal: Negative for abdominal distention.  Musculoskeletal: Negative for back pain.  Skin: Negative for color change.  Neurological: Negative for weakness.  Hematological: Negative for adenopathy.  Psychiatric/Behavioral: Negative for agitation.    HISTORY: Past Medical, Surgical, Social, and Family History Reviewed & Updated per EMR.   Pertinent Historical Findings include:  Past Medical History:  Diagnosis Date  . Allergy   . Anxiety   . Asthma     Past Surgical History:  Procedure Laterality Date  . LITHOTRIPSY  2003  . WRIST SURGERY Right 2016    No Known Allergies  Family History  Problem Relation Age of Onset  . Heart disease Mother   . Asthma Father      Social History   Socioeconomic History  . Marital status: Married    Spouse name: Not on file  . Number of children: Not on file  . Years of education: Not on file  . Highest education level: Not on file  Occupational History  . Not on file  Social Needs  . Financial resource strain: Not hard at all  . Food insecurity:    Worry: Never true    Inability: Never true  . Transportation needs:    Medical: No    Non-medical: No    Tobacco Use  . Smoking status: Never Smoker  . Smokeless tobacco: Never Used  Substance and Sexual Activity  . Alcohol use: Yes    Alcohol/week: 4.2 - 6.0 oz    Types: 7 - 10 Cans of beer per week  . Drug use: No  . Sexual activity: Yes  Lifestyle  . Physical activity:    Days per week: 0 days    Minutes per session: Not on file  . Stress: Not at all  Relationships  . Social connections:    Talks on phone: Not on file    Gets together: Not on file    Attends religious service: Not on file    Active member of club or organization: Not on file    Attends meetings of clubs or organizations: Not on file    Relationship status: Not on file  . Intimate partner violence:    Fear of current or ex partner: No    Emotionally abused: No    Physically abused: No    Forced sexual activity: No  Other Topics Concern  . Not on file  Social History Narrative  . Not on file     PHYSICAL EXAM:  VS: BP 138/74 (BP Location: Left Arm, Patient Position: Sitting, Cuff Size: Normal)   Pulse 98   Ht 5\' 9"  (1.753 m)   Wt 197 lb (89.4 kg)   SpO2  98%   BMI 29.09 kg/m  Physical Exam Gen: NAD, alert, cooperative with exam, well-appearing ENT: normal lips, normal nasal mucosa,  Eye: normal EOM, normal conjunctiva and lids CV:  no edema, +2 pedal pulses   Resp: no accessory muscle use, non-labored,  Skin: no rashes, no areas of induration  Neuro: normal tone, normal sensation to touch Psych:  normal insight, alert and oriented MSK:  Knee: Normal to inspection with no erythema or effusion or obvious bony abnormalities. Palpation normal with no warmth, joint line tenderness, patellar tenderness, or condyle tenderness. ROM full in flexion and extension and lower leg rotation. Ligaments with solid consistent endpoints including LCL, MCL. Negative Mcmurray's tests. Non painful patellar compression. Patellar glide without crepitus. Patellar and quadriceps tendons unremarkable. Hamstring and  quadriceps strength is normal.  Neurovascularly intact  Limited ultrasound: Left and right knee:  Left knee: No effusion suprapatellar pouch. Normal-appearing quadricep and patellar tendon. Normal appearing medial lateral meniscus. No increased vascular uptake of the patellar tendon  Right knee No effusion suprapatellar pouch. Normal-appearing quadricep and patellar tendon. Normal appearing medial lateral meniscus. No increased vascular uptake and patellar tendon  Summary: Normal exam  Ultrasound and interpretation by Clare Gandy, MD           ASSESSMENT & PLAN:   Acute pain of both knees Pain seems to be consistent with patellar tendinitis.  There does not appear to be any structural changes on ultrasound.   has been started on Mobic already. -Would advise to continue Mobic. -Counseled on home exercise therapy. -If no improvement will consider imaging, nitro patches versus physical therapy.

## 2018-03-28 DIAGNOSIS — M25562 Pain in left knee: Principal | ICD-10-CM

## 2018-03-28 DIAGNOSIS — M25561 Pain in right knee: Secondary | ICD-10-CM | POA: Insufficient documentation

## 2018-03-28 NOTE — Assessment & Plan Note (Signed)
Pain seems to be consistent with patellar tendinitis.  There does not appear to be any structural changes on ultrasound.   has been started on Mobic already. -Would advise to continue Mobic. -Counseled on home exercise therapy. -If no improvement will consider imaging, nitro patches versus physical therapy.

## 2018-04-17 ENCOUNTER — Ambulatory Visit: Payer: Managed Care, Other (non HMO) | Admitting: Family Medicine

## 2019-07-09 ENCOUNTER — Ambulatory Visit (INDEPENDENT_AMBULATORY_CARE_PROVIDER_SITE_OTHER): Payer: Managed Care, Other (non HMO) | Admitting: Emergency Medicine

## 2019-07-09 ENCOUNTER — Encounter: Payer: Self-pay | Admitting: Emergency Medicine

## 2019-07-09 ENCOUNTER — Emergency Department (HOSPITAL_COMMUNITY): Payer: Managed Care, Other (non HMO)

## 2019-07-09 ENCOUNTER — Encounter (HOSPITAL_COMMUNITY): Payer: Self-pay | Admitting: Emergency Medicine

## 2019-07-09 ENCOUNTER — Encounter (HOSPITAL_COMMUNITY)
Admission: EM | Disposition: A | Discharge status: Home / Self Care | Payer: Self-pay | Source: Home / Self Care | Attending: Emergency Medicine

## 2019-07-09 ENCOUNTER — Observation Stay (HOSPITAL_COMMUNITY)
Admission: EM | Admit: 2019-07-09 | Discharge: 2019-07-10 | Disposition: A | Discharge status: Home / Self Care | Payer: Managed Care, Other (non HMO) | Attending: General Surgery | Admitting: General Surgery

## 2019-07-09 ENCOUNTER — Ambulatory Visit (INDEPENDENT_AMBULATORY_CARE_PROVIDER_SITE_OTHER): Payer: Managed Care, Other (non HMO)

## 2019-07-09 ENCOUNTER — Observation Stay (HOSPITAL_COMMUNITY): Payer: Managed Care, Other (non HMO) | Admitting: Anesthesiology

## 2019-07-09 ENCOUNTER — Other Ambulatory Visit: Payer: Self-pay

## 2019-07-09 VITALS — BP 115/64 | HR 84 | Temp 98.3°F | Resp 16 | Ht 69.0 in | Wt 174.4 lb

## 2019-07-09 DIAGNOSIS — R1084 Generalized abdominal pain: Secondary | ICD-10-CM

## 2019-07-09 DIAGNOSIS — K353 Acute appendicitis with localized peritonitis, without perforation or gangrene: Secondary | ICD-10-CM

## 2019-07-09 DIAGNOSIS — M549 Dorsalgia, unspecified: Secondary | ICD-10-CM | POA: Diagnosis not present

## 2019-07-09 DIAGNOSIS — Z20828 Contact with and (suspected) exposure to other viral communicable diseases: Secondary | ICD-10-CM | POA: Insufficient documentation

## 2019-07-09 DIAGNOSIS — K3580 Unspecified acute appendicitis: Principal | ICD-10-CM | POA: Diagnosis present

## 2019-07-09 DIAGNOSIS — R109 Unspecified abdominal pain: Secondary | ICD-10-CM | POA: Diagnosis present

## 2019-07-09 HISTORY — PX: LAPAROSCOPIC APPENDECTOMY: SHX408

## 2019-07-09 LAB — POCT URINALYSIS DIP (MANUAL ENTRY)
Bilirubin, UA: NEGATIVE
Blood, UA: NEGATIVE
Glucose, UA: NEGATIVE mg/dL
Ketones, POC UA: NEGATIVE mg/dL
Leukocytes, UA: NEGATIVE
Nitrite, UA: NEGATIVE
Protein Ur, POC: NEGATIVE mg/dL
Spec Grav, UA: 1.01 (ref 1.010–1.025)
Urobilinogen, UA: 0.2 E.U./dL
pH, UA: 7 (ref 5.0–8.0)

## 2019-07-09 LAB — POCT CBC
Granulocyte percent: 76.8 %G (ref 37–80)
HCT, POC: 41.4 % — AB (ref 29–41)
Hemoglobin: 14.2 g/dL (ref 11–14.6)
Lymph, poc: 1.2 (ref 0.6–3.4)
MCH, POC: 30.5 pg (ref 27–31.2)
MCHC: 34.3 g/dL (ref 31.8–35.4)
MCV: 89 fL (ref 76–111)
MID (cbc): 0.2 (ref 0–0.9)
MPV: 7.7 fL (ref 0–99.8)
POC Granulocyte: 4.6 (ref 2–6.9)
POC LYMPH PERCENT: 19.9 %L (ref 10–50)
POC MID %: 3.3 %M (ref 0–12)
Platelet Count, POC: 295 10*3/uL (ref 142–424)
RBC: 4.65 M/uL — AB (ref 4.69–6.13)
RDW, POC: 13.4 %
WBC: 6 10*3/uL (ref 4.6–10.2)

## 2019-07-09 LAB — COMPREHENSIVE METABOLIC PANEL
ALT: 17 IU/L (ref 0–44)
ALT: 21 U/L (ref 0–44)
AST: 15 IU/L (ref 0–40)
AST: 17 U/L (ref 15–41)
Albumin/Globulin Ratio: 2.4 — ABNORMAL HIGH (ref 1.2–2.2)
Albumin: 5.1 g/dL (ref 4.1–5.2)
Albumin: 5.2 g/dL — ABNORMAL HIGH (ref 3.5–5.0)
Alkaline Phosphatase: 68 IU/L (ref 39–117)
Alkaline Phosphatase: 55 U/L (ref 38–126)
Anion gap: 10 (ref 5–15)
BUN/Creatinine Ratio: 7 — ABNORMAL LOW (ref 9–20)
BUN: 6 mg/dL (ref 6–20)
BUN: 6 mg/dL (ref 6–20)
Bilirubin Total: 0.5 mg/dL (ref 0.0–1.2)
CO2: 24 mmol/L (ref 20–29)
CO2: 25 mmol/L (ref 22–32)
Calcium: 9.9 mg/dL (ref 8.7–10.2)
Calcium: 9.6 mg/dL (ref 8.9–10.3)
Chloride: 101 mmol/L (ref 96–106)
Chloride: 105 mmol/L (ref 98–111)
Creatinine, Ser: 0.9 mg/dL (ref 0.76–1.27)
Creatinine, Ser: 0.8 mg/dL (ref 0.61–1.24)
GFR calc Af Amer: 132 mL/min/{1.73_m2} (ref >59)
GFR calc Af Amer: >60 mL/min (ref >60)
GFR calc non Af Amer: 114 mL/min/{1.73_m2} (ref >59)
GFR calc non Af Amer: >60 mL/min (ref >60)
Globulin, Total: 2.1 g/dL (ref 1.5–4.5)
Glucose, Bld: 103 mg/dL — ABNORMAL HIGH (ref 70–99)
Glucose: 94 mg/dL (ref 65–99)
Potassium: 4.2 mmol/L (ref 3.5–5.2)
Potassium: 3.9 mmol/L (ref 3.5–5.1)
Sodium: 140 mmol/L (ref 134–144)
Sodium: 140 mmol/L (ref 135–145)
Total Bilirubin: 0.6 mg/dL (ref 0.3–1.2)
Total Protein: 7.2 g/dL (ref 6.0–8.5)
Total Protein: 7.9 g/dL (ref 6.5–8.1)

## 2019-07-09 LAB — CBC WITH DIFFERENTIAL/PLATELET
Abs Immature Granulocytes: 0.02 10*3/uL (ref 0.00–0.07)
Basophils Absolute: 0 10*3/uL (ref 0.0–0.1)
Basophils Relative: 1 %
Eosinophils Absolute: 0.1 10*3/uL (ref 0.0–0.5)
Eosinophils Relative: 2 %
HCT: 42.1 % (ref 39.0–52.0)
Hemoglobin: 13.8 g/dL (ref 13.0–17.0)
Immature Granulocytes: 0 %
Lymphocytes Relative: 23 %
Lymphs Abs: 1.6 10*3/uL (ref 0.7–4.0)
MCH: 29.9 pg (ref 26.0–34.0)
MCHC: 32.8 g/dL (ref 30.0–36.0)
MCV: 91.1 fL (ref 80.0–100.0)
Monocytes Absolute: 0.6 10*3/uL (ref 0.1–1.0)
Monocytes Relative: 8 %
Neutro Abs: 4.8 10*3/uL (ref 1.7–7.7)
Neutrophils Relative %: 66 %
Platelets: 293 10*3/uL (ref 150–400)
RBC: 4.62 MIL/uL (ref 4.22–5.81)
RDW: 13.1 % (ref 11.5–15.5)
WBC: 7.2 10*3/uL (ref 4.0–10.5)
nRBC: 0 % (ref 0.0–0.2)

## 2019-07-09 LAB — SURGICAL PCR SCREEN
MRSA, PCR: NEGATIVE
Staphylococcus aureus: NEGATIVE

## 2019-07-09 LAB — SARS CORONAVIRUS 2 BY RT PCR (HOSPITAL ORDER, PERFORMED IN ~~LOC~~ HOSPITAL LAB): SARS Coronavirus 2: NEGATIVE

## 2019-07-09 LAB — LIPASE, BLOOD: Lipase: 29 U/L (ref 11–51)

## 2019-07-09 SURGERY — APPENDECTOMY, LAPAROSCOPIC
Anesthesia: General

## 2019-07-09 MED ORDER — SODIUM CHLORIDE 0.9 % IV BOLUS
1000.0000 mL | Freq: Once | INTRAVENOUS | Status: AC
  Administered 2019-07-09: 1000 mL via INTRAVENOUS

## 2019-07-09 MED ORDER — SIMETHICONE 80 MG PO CHEW: 40.0000 mg | CHEWABLE_TABLET | Freq: Four times a day (QID) | ORAL | Status: DC | PRN

## 2019-07-09 MED ORDER — DEXAMETHASONE SODIUM PHOSPHATE 10 MG/ML IJ SOLN
INTRAMUSCULAR | Status: AC
  Filled 2019-07-09: qty 1

## 2019-07-09 MED ORDER — OXYCODONE HCL 5 MG/5ML PO SOLN: 5.0000 mg | Freq: Once | ORAL | Status: AC | PRN

## 2019-07-09 MED ORDER — MIDAZOLAM HCL 5 MG/5ML IJ SOLN
INTRAMUSCULAR | Status: DC | PRN
  Administered 2019-07-09: 2 mg via INTRAVENOUS

## 2019-07-09 MED ORDER — MORPHINE SULFATE (PF) 2 MG/ML IV SOLN: 2.0000 mg | INTRAVENOUS | Status: DC | PRN

## 2019-07-09 MED ORDER — SODIUM CHLORIDE 0.9 % IV SOLN
INTRAVENOUS | Status: DC
  Administered 2019-07-09: 18:00:00 via INTRAVENOUS

## 2019-07-09 MED ORDER — SUCCINYLCHOLINE CHLORIDE 20 MG/ML IJ SOLN
INTRAMUSCULAR | Status: DC | PRN
  Administered 2019-07-09: 120 mg via INTRAVENOUS

## 2019-07-09 MED ORDER — LIDOCAINE 2% (20 MG/ML) 5 ML SYRINGE
INTRAMUSCULAR | Status: AC
  Filled 2019-07-09: qty 5

## 2019-07-09 MED ORDER — ONDANSETRON HCL 4 MG/2ML IJ SOLN: 4.0000 mg | Freq: Once | INTRAMUSCULAR | Status: DC | PRN

## 2019-07-09 MED ORDER — DOCUSATE SODIUM 100 MG PO CAPS
100.0000 mg | ORAL_CAPSULE | Freq: Two times a day (BID) | ORAL | Status: DC
  Administered 2019-07-09 (×2): 100 mg via ORAL
  Filled 2019-07-09 (×2): qty 1

## 2019-07-09 MED ORDER — ONDANSETRON HCL 4 MG/2ML IJ SOLN
INTRAMUSCULAR | Status: AC
  Filled 2019-07-09: qty 2

## 2019-07-09 MED ORDER — BUPIVACAINE HCL (PF) 0.25 % IJ SOLN
INTRAMUSCULAR | Status: AC
  Filled 2019-07-09: qty 30

## 2019-07-09 MED ORDER — MEPERIDINE HCL 50 MG/ML IJ SOLN
INTRAMUSCULAR | Status: AC
  Filled 2019-07-09: qty 1

## 2019-07-09 MED ORDER — SODIUM CHLORIDE (PF) 0.9 % IJ SOLN
INTRAMUSCULAR | Status: AC
  Filled 2019-07-09: qty 50

## 2019-07-09 MED ORDER — METRONIDAZOLE IN NACL 5-0.79 MG/ML-% IV SOLN: 500.0000 mg | INTRAVENOUS | Status: DC

## 2019-07-09 MED ORDER — SODIUM CHLORIDE 0.9 % IV SOLN
2.0000 g | Freq: Once | INTRAVENOUS | Status: AC
  Administered 2019-07-09: 2 g via INTRAVENOUS
  Filled 2019-07-09: qty 20

## 2019-07-09 MED ORDER — FENTANYL CITRATE (PF) 100 MCG/2ML IJ SOLN
25.0000 ug | INTRAMUSCULAR | Status: DC | PRN
  Administered 2019-07-09 (×2): 50 ug via INTRAVENOUS

## 2019-07-09 MED ORDER — ONDANSETRON HCL 4 MG/2ML IJ SOLN: 4.0000 mg | Freq: Four times a day (QID) | INTRAMUSCULAR | Status: DC | PRN

## 2019-07-09 MED ORDER — MIDAZOLAM HCL 2 MG/2ML IJ SOLN
INTRAMUSCULAR | Status: AC
  Filled 2019-07-09: qty 2

## 2019-07-09 MED ORDER — BUPIVACAINE HCL (PF) 0.25 % IJ SOLN
INTRAMUSCULAR | Status: DC | PRN
  Administered 2019-07-09: 20 mL

## 2019-07-09 MED ORDER — MEPERIDINE HCL 50 MG/ML IJ SOLN
6.2500 mg | INTRAMUSCULAR | Status: DC | PRN
  Administered 2019-07-09 (×2): 6.25 mg via INTRAVENOUS

## 2019-07-09 MED ORDER — PANTOPRAZOLE SODIUM 40 MG IV SOLR
40.0000 mg | Freq: Every day | INTRAVENOUS | Status: DC
  Administered 2019-07-09: 40 mg via INTRAVENOUS
  Filled 2019-07-09: qty 40

## 2019-07-09 MED ORDER — OXYCODONE HCL 5 MG PO TABS
5.0000 mg | ORAL_TABLET | Freq: Once | ORAL | Status: AC | PRN
  Administered 2019-07-09: 5 mg via ORAL

## 2019-07-09 MED ORDER — DIPHENHYDRAMINE HCL 25 MG PO CAPS: 25.0000 mg | ORAL_CAPSULE | Freq: Four times a day (QID) | ORAL | Status: DC | PRN

## 2019-07-09 MED ORDER — IOHEXOL 300 MG/ML  SOLN
100.0000 mL | Freq: Once | INTRAMUSCULAR | Status: AC | PRN
  Administered 2019-07-09: 100 mL via INTRAVENOUS

## 2019-07-09 MED ORDER — MUPIROCIN 2 % EX OINT: 1.0000 "application " | TOPICAL_OINTMENT | Freq: Two times a day (BID) | CUTANEOUS | Status: DC

## 2019-07-09 MED ORDER — ONDANSETRON 4 MG PO TBDP: 4.0000 mg | ORAL_TABLET | Freq: Four times a day (QID) | ORAL | Status: DC | PRN

## 2019-07-09 MED ORDER — ACETAMINOPHEN 325 MG PO TABS: 325.0000 mg | ORAL_TABLET | ORAL | Status: DC | PRN

## 2019-07-09 MED ORDER — OXYCODONE HCL 5 MG PO TABS
ORAL_TABLET | ORAL | Status: AC
  Filled 2019-07-09: qty 1

## 2019-07-09 MED ORDER — FENTANYL CITRATE (PF) 100 MCG/2ML IJ SOLN
INTRAMUSCULAR | Status: AC
  Filled 2019-07-09: qty 2

## 2019-07-09 MED ORDER — LIDOCAINE 2% (20 MG/ML) 5 ML SYRINGE
INTRAMUSCULAR | Status: DC | PRN
  Administered 2019-07-09: 80 mg via INTRAVENOUS

## 2019-07-09 MED ORDER — OXYCODONE HCL 5 MG PO TABS
5.0000 mg | ORAL_TABLET | ORAL | Status: DC | PRN
  Administered 2019-07-09 – 2019-07-10 (×2): 5 mg via ORAL
  Filled 2019-07-09 (×2): qty 1

## 2019-07-09 MED ORDER — FENTANYL CITRATE (PF) 250 MCG/5ML IJ SOLN
INTRAMUSCULAR | Status: AC
  Filled 2019-07-09: qty 5

## 2019-07-09 MED ORDER — ACETAMINOPHEN 650 MG RE SUPP: 650.0000 mg | Freq: Four times a day (QID) | RECTAL | Status: DC | PRN

## 2019-07-09 MED ORDER — ONDANSETRON HCL 4 MG/2ML IJ SOLN
INTRAMUSCULAR | Status: DC | PRN
  Administered 2019-07-09: 4 mg via INTRAVENOUS

## 2019-07-09 MED ORDER — ACETAMINOPHEN 160 MG/5ML PO SOLN: 325.0000 mg | ORAL | Status: DC | PRN

## 2019-07-09 MED ORDER — ROCURONIUM BROMIDE 10 MG/ML (PF) SYRINGE
PREFILLED_SYRINGE | INTRAVENOUS | Status: DC | PRN
  Administered 2019-07-09: 40 mg via INTRAVENOUS

## 2019-07-09 MED ORDER — SODIUM CHLORIDE 0.9 % IV SOLN: 2.0000 g | INTRAVENOUS | Status: DC

## 2019-07-09 MED ORDER — SODIUM CHLORIDE 0.9 % IR SOLN
Status: DC | PRN
  Administered 2019-07-09: 1000 mL

## 2019-07-09 MED ORDER — HYDROMORPHONE HCL 1 MG/ML IJ SOLN
INTRAMUSCULAR | Status: AC
  Filled 2019-07-09: qty 1

## 2019-07-09 MED ORDER — PROPOFOL 10 MG/ML IV BOLUS
INTRAVENOUS | Status: DC | PRN
  Administered 2019-07-09: 200 mg via INTRAVENOUS

## 2019-07-09 MED ORDER — POLYETHYLENE GLYCOL 3350 17 G PO PACK: 17.0000 g | PACK | Freq: Every day | ORAL | Status: DC | PRN

## 2019-07-09 MED ORDER — MORPHINE SULFATE (PF) 4 MG/ML IV SOLN
4.0000 mg | Freq: Once | INTRAVENOUS | Status: AC
  Administered 2019-07-09: 4 mg via INTRAVENOUS
  Filled 2019-07-09: qty 1

## 2019-07-09 MED ORDER — DEXAMETHASONE SODIUM PHOSPHATE 10 MG/ML IJ SOLN
INTRAMUSCULAR | Status: DC | PRN
  Administered 2019-07-09: 5 mg via INTRAVENOUS

## 2019-07-09 MED ORDER — PROPOFOL 10 MG/ML IV BOLUS
INTRAVENOUS | Status: AC
  Filled 2019-07-09: qty 20

## 2019-07-09 MED ORDER — SUGAMMADEX SODIUM 200 MG/2ML IV SOLN
INTRAVENOUS | Status: DC | PRN
  Administered 2019-07-09: 200 mg via INTRAVENOUS

## 2019-07-09 MED ORDER — ONDANSETRON HCL 4 MG/2ML IJ SOLN
4.0000 mg | Freq: Once | INTRAMUSCULAR | Status: AC
  Administered 2019-07-09: 15:00:00 4 mg via INTRAVENOUS
  Filled 2019-07-09: qty 2

## 2019-07-09 MED ORDER — DIPHENHYDRAMINE HCL 50 MG/ML IJ SOLN: 25.0000 mg | Freq: Four times a day (QID) | INTRAMUSCULAR | Status: DC | PRN

## 2019-07-09 MED ORDER — METOPROLOL TARTRATE 5 MG/5ML IV SOLN: 5.0000 mg | Freq: Four times a day (QID) | INTRAVENOUS | Status: DC | PRN

## 2019-07-09 MED ORDER — LACTATED RINGERS IV SOLN
INTRAVENOUS | Status: DC | PRN
  Administered 2019-07-09 (×2): via INTRAVENOUS

## 2019-07-09 MED ORDER — ROCURONIUM BROMIDE 10 MG/ML (PF) SYRINGE
PREFILLED_SYRINGE | INTRAVENOUS | Status: AC
  Filled 2019-07-09: qty 10

## 2019-07-09 MED ORDER — ACETAMINOPHEN 325 MG PO TABS: 650.0000 mg | ORAL_TABLET | Freq: Four times a day (QID) | ORAL | Status: DC | PRN

## 2019-07-09 MED ORDER — METHOCARBAMOL 500 MG PO TABS
500.0000 mg | ORAL_TABLET | Freq: Four times a day (QID) | ORAL | Status: DC | PRN
  Administered 2019-07-09: 500 mg via ORAL
  Filled 2019-07-09: qty 1

## 2019-07-09 MED ORDER — METRONIDAZOLE IN NACL 5-0.79 MG/ML-% IV SOLN
500.0000 mg | Freq: Once | INTRAVENOUS | Status: AC
  Administered 2019-07-09: 500 mg via INTRAVENOUS
  Filled 2019-07-09: qty 100

## 2019-07-09 MED ORDER — FENTANYL CITRATE (PF) 100 MCG/2ML IJ SOLN
INTRAMUSCULAR | Status: DC | PRN
  Administered 2019-07-09 (×2): 100 ug via INTRAVENOUS
  Administered 2019-07-09: 50 ug via INTRAVENOUS

## 2019-07-09 SURGICAL SUPPLY — 32 items
APPLIER CLIP ROT 10 11.4 M/L (STAPLE) ×3
CABLE HIGH FREQUENCY MONO STRZ (ELECTRODE) ×3 IMPLANT
CHLORAPREP W/TINT 26 (MISCELLANEOUS) ×3 IMPLANT
CLIP APPLIE ROT 10 11.4 M/L (STAPLE) IMPLANT
COVER WAND RF STERILE (DRAPES) IMPLANT
CUTTER FLEX LINEAR 45M (STAPLE) ×2 IMPLANT
DECANTER SPIKE VIAL GLASS SM (MISCELLANEOUS) ×3 IMPLANT
DERMABOND ADVANCED (GAUZE/BANDAGES/DRESSINGS) ×2
DERMABOND ADVANCED .7 DNX12 (GAUZE/BANDAGES/DRESSINGS) ×1 IMPLANT
ELECT REM PT RETURN 15FT ADLT (MISCELLANEOUS) ×3 IMPLANT
ENDOLOOP SUT PDS II  0 18 (SUTURE)
ENDOLOOP SUT PDS II 0 18 (SUTURE) IMPLANT
GLOVE BIO SURGEON STRL SZ7.5 (GLOVE) ×7 IMPLANT
GOWN STRL REUS W/TWL XL LVL3 (GOWN DISPOSABLE) ×6 IMPLANT
KIT BASIN OR (CUSTOM PROCEDURE TRAY) ×3 IMPLANT
KIT TURNOVER KIT A (KITS) ×2 IMPLANT
POUCH SPECIMEN RETRIEVAL 10MM (ENDOMECHANICALS) ×3 IMPLANT
RELOAD 45 VASCULAR/THIN (ENDOMECHANICALS) IMPLANT
RELOAD STAPLE 45 2.5 WHT GRN (ENDOMECHANICALS) IMPLANT
RELOAD STAPLE 45 3.5 BLU ETS (ENDOMECHANICALS) IMPLANT
RELOAD STAPLE TA45 3.5 REG BLU (ENDOMECHANICALS) ×3 IMPLANT
SCISSORS LAP 5X35 DISP (ENDOMECHANICALS) ×3 IMPLANT
SET IRRIG TUBING LAPAROSCOPIC (IRRIGATION / IRRIGATOR) ×3 IMPLANT
SET TUBE SMOKE EVAC HIGH FLOW (TUBING) ×3 IMPLANT
SHEARS HARMONIC ACE PLUS 36CM (ENDOMECHANICALS) ×3 IMPLANT
SLEEVE XCEL OPT CAN 5 100 (ENDOMECHANICALS) ×2 IMPLANT
SUT MNCRL AB 4-0 PS2 18 (SUTURE) ×3 IMPLANT
TOWEL OR 17X26 10 PK STRL BLUE (TOWEL DISPOSABLE) ×3 IMPLANT
TRAY FOLEY MTR SLVR 16FR STAT (SET/KITS/TRAYS/PACK) ×2 IMPLANT
TRAY LAPAROSCOPIC (CUSTOM PROCEDURE TRAY) ×3 IMPLANT
TROCAR BLADELESS OPT 5 100 (ENDOMECHANICALS) ×3 IMPLANT
TROCAR XCEL BLUNT TIP 100MML (ENDOMECHANICALS) ×3 IMPLANT

## 2019-07-09 NOTE — Transfer of Care (Signed)
Immediate Anesthesia Transfer of Care Note  Patient: Taylor Baker  Procedure(s) Performed: APPENDECTOMY LAPAROSCOPIC (N/A )  Patient Location: PACU  Anesthesia Type:General  Level of Consciousness: awake, alert  and oriented  Airway & Oxygen Therapy: Patient Spontanous Breathing and Patient connected to face mask oxygen  Post-op Assessment: Report given to RN and Post -op Vital signs reviewed and stable  Post vital signs: Reviewed and stable  Last Vitals:  Vitals Value Taken Time  BP    Temp    Pulse 113 07/09/19 2100  Resp 25 07/09/19 2059  SpO2 100 % 07/09/19 2100  Vitals shown include unvalidated device data.  Last Pain:  Vitals:   07/09/19 1757  TempSrc: Oral  PainSc:          Complications: No apparent anesthesia complications

## 2019-07-09 NOTE — Anesthesia Procedure Notes (Signed)
Procedure Name: Intubation Performed by: Shanel Prazak J, CRNA Pre-anesthesia Checklist: Patient identified, Emergency Drugs available, Suction available, Patient being monitored and Timeout performed Patient Re-evaluated:Patient Re-evaluated prior to induction Oxygen Delivery Method: Circle system utilized Preoxygenation: Pre-oxygenation with 100% oxygen Induction Type: IV induction and Rapid sequence Laryngoscope Size: Mac and 4 Grade View: Grade I Tube type: Oral Tube size: 7.5 mm Number of attempts: 1 Airway Equipment and Method: Stylet Placement Confirmation: ETT inserted through vocal cords under direct vision,  positive ETCO2 and breath sounds checked- equal and bilateral Secured at: 23 cm Tube secured with: Tape Dental Injury: Teeth and Oropharynx as per pre-operative assessment        

## 2019-07-09 NOTE — ED Provider Notes (Addendum)
[facility]-EMERGENCY DEPT Provider Note   CSN: [id number] Arrival date & time: 07/09/19  1208     History   Chief Complaint Chief Complaint  Patient presents with  . Abdominal Pain    HPI Taylor Baker is a 28 y.o. male.     Taylor Baker is a 28 y.o. male with a history of asthma, anxiety and allergies, as well as previous lithotripsy, who presents to the ED from PCPs office for evaluation of right sided abdominal pain.  Pain began about 24 hours ago.  Pain was initially located in the epigastric and periumbilical region and then seemed to migrate to the right side of the abdomen.  Patient reports pain feels higher up but when his PCP pressed he was most tender in the lower quadrant and right side.  He has had some mild nausea but no vomiting, no diarrhea.  He has not noted any fevers or chills.  No dysuria or urinary frequency and no hematuria.  He denies any history of similar pain in the past.  No previous abdominal surgeries.  No chest pain, shortness of breath or cough.  No sick contacts.  No other aggravating or alleviating factors.  Patient sent from PCP to rule out appendicitis.     Past Medical History:  Diagnosis Date  . Allergy   . Anxiety   . Asthma     There are no active problems to display for this patient.   Past Surgical History:  Procedure Laterality Date  . LITHOTRIPSY  2003  . WRIST SURGERY Right 2016        Home Medications    Prior to Admission medications   Medication Sig Start Date End Date Taking? Authorizing Provider  Ascorbic Acid (VITAMIN C) 1000 MG tablet Take 1,000 mg by mouth daily.    [provider]  cholecalciferol (VITAMIN D) 1000 units tablet Take 1,000 Units by mouth daily.    [provider]  meloxicam (MOBIC) 7.5 MG tablet Take 1-2 tablets (7.5-15 mg total) by mouth daily. Patient not taking: Reported on 07/09/2019 03/26/18   McVey, Madelaine Bhat, PA-C  Multiple Vitamin  (MULTIVITAMIN) tablet Take 1 tablet by mouth daily.    [provider]  vitamin B-12 (CYANOCOBALAMIN) 1000 MCG tablet Take 1,000 mcg by mouth daily.    [provider]    Family History Family History  Problem Relation Age of Onset  . Heart disease Mother   . Asthma Father     Social History Social History   Tobacco Use  . Smoking status: Never Smoker  . Smokeless tobacco: Never Used  Substance Use Topics  . Alcohol use: Yes    Alcohol/week: 7.0 - 10.0 standard drinks    Types: 7 - 10 Cans of beer per week  . Drug use: No     Allergies   Patient has no known allergies.   Review of Systems Review of Systems  Constitutional: Negative for chills and fever.  HENT: Negative.   Respiratory: Negative for cough and shortness of breath.   Cardiovascular: Negative for chest pain.  Gastrointestinal: Positive for abdominal pain and nausea. Negative for blood in stool, diarrhea and vomiting.  Genitourinary: Negative for dysuria, flank pain, frequency and hematuria.  Musculoskeletal: Negative for arthralgias, back pain and myalgias.  Skin: Negative for color change and rash.  All other systems reviewed and are negative.    Physical Exam Updated Vital Signs BP 122/75 (BP Location: Left Arm)   Pulse  72   Temp 99.1 F (37.3 C) (Oral)   Resp 18   SpO2 100%   Physical Exam Vitals signs and nursing note reviewed.  Constitutional:      General: He is not in acute distress.    Appearance: He is well-developed and normal weight. He is not diaphoretic.     Comments: Well-appearing and in no distress.  HENT:     Head: Normocephalic and atraumatic.     Mouth/Throat:     Mouth: Mucous membranes are moist.     Pharynx: Oropharynx is clear.  Eyes:     General:        Right eye: No discharge.        Left eye: No discharge.     Pupils: Pupils are equal, round, and reactive to light.  Neck:     Musculoskeletal: Neck supple.  Cardiovascular:     Rate and  Rhythm: Normal rate and regular rhythm.     Heart sounds: Normal heart sounds. No murmur. No friction rub. No gallop.   Pulmonary:     Effort: Pulmonary effort is normal. No respiratory distress.     Breath sounds: Normal breath sounds. No wheezing or rales.     Comments: Respirations equal and unlabored, patient able to speak in full sentences, lungs clear to auscultation bilaterally Abdominal:     General: Bowel sounds are normal. There is no distension.     Palpations: Abdomen is soft. There is no mass.     Tenderness: There is abdominal tenderness in the right upper quadrant and right lower quadrant. There is guarding. There is no right CVA tenderness or left CVA tenderness.     Comments: Abdomen soft, nondistended, bowel sounds present throughout.  Patient has tenderness in the right upper and lower quadrants but guarding with palpation in the right lower quadrant, tenderness present at McBurney's point.  No rebound tenderness.  No CVA tenderness bilaterally.  Musculoskeletal:        General: No deformity.  Skin:    General: Skin is warm and dry.     Capillary Refill: Capillary refill takes less than 2 seconds.  Neurological:     Mental Status: He is alert.     Coordination: Coordination normal.     Comments: Speech is clear, able to follow commands Moves extremities without ataxia, coordination intact  Psychiatric:        Mood and Affect: Mood normal.        Behavior: Behavior normal.      ED Treatments / Results  Labs (all labs ordered are listed, but only abnormal results are displayed) Labs Reviewed  COMPREHENSIVE METABOLIC PANEL - Abnormal; Notable for the following components:      Result Value   Glucose, Bld 103 (*)    Albumin 5.2 (*)    All other components within normal limits  SARS CORONAVIRUS 2 BY RT PCR (HOSPITAL ORDER, PERFORMED IN [facility])  LIPASE, BLOOD  CBC WITH DIFFERENTIAL/PLATELET  URINALYSIS, ROUTINE W REFLEX MICROSCOPIC    EKG  None  Radiology Ct Abdomen Pelvis W Contrast  Result Date: 07/09/2019 CLINICAL DATA:  Patient with abdominal pain. Evaluate for appendicitis. EXAM: CT ABDOMEN AND PELVIS WITH CONTRAST TECHNIQUE: Multidetector CT imaging of the abdomen and pelvis was performed using the standard protocol following bolus administration of intravenous contrast. CONTRAST:  OMNIPAQUE IOHEXOL 300 MG/ML  SOLN COMPARISON:  None. FINDINGS: Lower chest: Normal heart size. Lung bases are clear. No pleural effusion. Hepatobiliary: Liver  is normal in size and contour. Subcentimeter too small to characterize low-attenuation lesion hepatic dome (image 20; series 2). Gallbladder is unremarkable. No intrahepatic or extrahepatic biliary ductal dilatation. Pancreas: Unremarkable Spleen: Unremarkable Adrenals/Urinary Tract: Normal adrenal glands. Kidneys enhance symmetrically with contrast. No hydronephrosis. Urinary bladder is unremarkable. Stomach/Bowel: The appendix is dilated measuring 10 mm (image 61; series 2) with periappendiceal fat stranding. No CT evidence to suggest appendiceal rupture. Normal morphology of the stomach. No evidence for small bowel obstruction. No free fluid or free intraperitoneal air. Vascular/Lymphatic: Normal caliber abdominal aorta. No retroperitoneal lymphadenopathy. Prominent right lower quadrant mesenteric nodes, likely reactive. Reproductive: Unremarkable. Other: None. Musculoskeletal: No aggressive or acute appearing osseous lesions. IMPRESSION: 1. Findings compatible with acute non perforated appendicitis. Electronically Signed   By: Lovey Newcomer M.D.   On: 07/09/2019 15:59   Dg Abd Acute 2+v W 1v Chest  Result Date: 07/09/2019 CLINICAL DATA:  Generalized abdominal pain. EXAM: DG ABDOMEN ACUTE W/ 1V CHEST COMPARISON:  Chest x-ray dated 01/26/2010 FINDINGS: There is no evidence of dilated bowel loops or free intraperitoneal air. No radiopaque calculi or other significant radiographic abnormality is  seen. Heart size and mediastinal contours are within normal limits. Both lungs are clear. IMPRESSION: Negative abdominal radiographs.  No acute cardiopulmonary disease. Electronically Signed   By: Lorriane Shire M.D.   On: 07/09/2019 11:10    Procedures .Critical Care Performed by: Jacqlyn Larsen, PA-C Authorized by: Jacqlyn Larsen, PA-C   Critical care provider statement:    Critical care time (minutes):  45   Critical care was necessary to treat or prevent imminent or life-threatening deterioration of the following conditions: Acute appendicitis.   Critical care was time spent personally by me on the following activities:  Discussions with consultants, evaluation of patient's response to treatment, examination of patient, ordering and performing treatments and interventions, ordering and review of laboratory studies, ordering and review of radiographic studies, pulse oximetry, re-evaluation of patient's condition, obtaining history from patient or surrogate and review of old charts   (including critical care time)  Medications Ordered in ED Medications  sodium chloride (PF) 0.9 % injection (has no administration in time range)  cefTRIAXone (ROCEPHIN) 2 g in sodium chloride 0.9 % 100 mL IVPB (has no administration in time range)    And  metroNIDAZOLE (FLAGYL) IVPB 500 mg (has no administration in time range)  morphine 4 MG/ML injection 4 mg (4 mg Intravenous Given 07/09/19 1507)  ondansetron (ZOFRAN) injection 4 mg (4 mg Intravenous Given 07/09/19 1507)  sodium chloride 0.9 % bolus 1,000 mL (1,000 mLs Intravenous New Bag/Given 07/09/19 1508)  iohexol (OMNIPAQUE) 300 MG/ML solution 100 mL (100 mLs Intravenous Contrast Given 07/09/19 1522)     Initial Impression / Assessment and Plan / ED Course  I have reviewed the triage vital signs and the nursing notes.  Pertinent labs & imaging results that were available during my care of the patient were reviewed by me and considered in my medical  decision making (see chart for details).  28 year old male presents with 24 hours of right-sided abdominal pain, seen by PCP and sent to ED to rule out appendicitis.  On arrival patient with normal vitals, well-appearing, on exam he has right-sided abdominal tenderness with guarding in the right lower quadrant, tenderness present at McBurney's point.  Exam certainly concerning for appendicitis, would also consider cholecystitis, patient does not have flank pain and pain does not seem characteristic of kidney stone.  Will get abdominal labs and CT  abdomen pelvis.  IV fluids, morphine and Zofran ordered for symptom management.  Lab work is thus far reassuring, no leukocytosis, normal hemoglobin, no electrolyte derangements, normal renal and liver function and normal lipase.  Awaiting urinalysis.  CT consistent with acute nonperforated appendicitis.  Will get COVID test, and start patient on IV Rocephin and Flagyl.  Will consult surgery for admission and appendectomy.  Case discussed with PA Leary RocaMichael Maczis with general surgery who will be down to see and admit the patient.  Final Clinical Impressions(s) / ED Diagnoses   Final diagnoses:  Acute appendicitis with localized peritonitis, without perforation, abscess, or gangrene    ED Discharge Orders    None       Dartha LodgeFord, Else Habermann N, PA-C 07/09/19 1622    Dartha LodgeFord, [name] N, PA-C 07/11/19 1116    Sabas SousBero, Michael M, MD 07/14/19 2047

## 2019-07-09 NOTE — Anesthesia Preprocedure Evaluation (Addendum)
Anesthesia Evaluation  Patient identified by MRN, date of birth, ID band Patient awake    Reviewed: Allergy & Precautions, H&P , NPO status , Patient's Chart, lab work & pertinent test results  Airway Mallampati: I   Neck ROM: full    Dental   Pulmonary neg pulmonary ROS,    breath sounds clear to auscultation       Cardiovascular Exercise Tolerance: Good negative cardio ROS   Rhythm:regular Rate:Normal     Neuro/Psych negative neurological ROS  negative psych ROS   GI/Hepatic negative GI ROS, Neg liver ROS,   Endo/Other  negative endocrine ROS  Renal/GU negative Renal ROS  negative genitourinary   Musculoskeletal   Abdominal   Peds  Hematology negative hematology ROS (+)   Anesthesia Other Findings   Reproductive/Obstetrics negative OB ROS                           Anesthesia Physical Anesthesia Plan  ASA: II and emergent  Anesthesia Plan: General   Post-op Pain Management:    Induction: Intravenous and Cricoid pressure planned  PONV Risk Score and Plan: 2 and Dexamethasone and Treatment may vary due to age or medical condition  Airway Management Planned: Oral ETT  Additional Equipment:   Intra-op Plan:   Post-operative Plan: Extubation in OR  Informed Consent: I have reviewed the patients History and Physical, chart, labs and discussed the procedure including the risks, benefits and alternatives for the proposed anesthesia with the patient or authorized representative who has indicated his/her understanding and acceptance.     Dental Advisory Given  Plan Discussed with: CRNA and Anesthesiologist  Anesthesia Plan Comments: (  )        Anesthesia Quick Evaluation

## 2019-07-09 NOTE — ED Triage Notes (Signed)
Patient reports sent from PCP for evaluation of right lower quadrant pain and right flank pain. Denies N/V/D.

## 2019-07-09 NOTE — Progress Notes (Signed)
Taylor Baker 28 Berna Buey.o.   Chief Complaint  Patient presents with  . Abdominal Pain    upper to the right side started yesterday  . Back Pain    lower last weekend    HISTORY OF PRESENT ILLNESS: This is a 28 y.o. male complaining of abdominal pain that started yesterday about 24 hours ago.  Initially pain was in the epigastric area but he has now shifted to the right side both upper and lower abdomen.  Pain progressively getting worse.  No fever or chills.  No nausea or vomiting.  No diarrhea.  No urinary symptoms.  Had lower back pain last weekend but does not think this is related to that event.  No other significant symptoms.  HPI   Prior to Admission medications   Medication Sig Start Date End Date Taking? Authorizing Provider  Ascorbic Acid (VITAMIN C) 1000 MG tablet Take 1,000 mg by mouth daily.   Yes [provider]  Multiple Vitamin (MULTIVITAMIN) tablet Take 1 tablet by mouth daily.   Yes [provider]  cholecalciferol (VITAMIN D) 1000 units tablet Take 1,000 Units by mouth daily.    [provider]  meloxicam (MOBIC) 7.5 MG tablet Take 1-2 tablets (7.5-15 mg total) by mouth daily. Patient not taking: Reported on 07/09/2019 03/26/18   McVey, Madelaine BhatElizabeth Whitney, PA-C  vitamin B-12 (CYANOCOBALAMIN) 1000 MCG tablet Take 1,000 mcg by mouth daily.    [provider]    No Known Allergies  There are no active problems to display for this patient.   Past Medical History:  Diagnosis Date  . Allergy   . Anxiety   . Asthma     Past Surgical History:  Procedure Laterality Date  . LITHOTRIPSY  2003  . WRIST SURGERY Right 2016    Social History   Socioeconomic History  . Marital status: Married    Spouse name: Not on file  . Number of children: Not on file  . Years of education: Not on file  . Highest education level: Not on file  Occupational History  . Not on file  Social Needs  . Financial resource strain: Not hard at all   . Food insecurity    Worry: Never true    Inability: Never true  . Transportation needs    Medical: No    Non-medical: No  Tobacco Use  . Smoking status: Never Smoker  . Smokeless tobacco: Never Used  Substance and Sexual Activity  . Alcohol use: Yes    Alcohol/week: 7.0 - 10.0 standard drinks    Types: 7 - 10 Cans of beer per week  . Drug use: No  . Sexual activity: Yes  Lifestyle  . Physical activity    Days per week: 0 days    Minutes per session: Not on file  . Stress: Not at all  Relationships  . Social Musicianconnections    Talks on phone: Not on file    Gets together: Not on file    Attends religious service: Not on file    Active member of club or organization: Not on file    Attends meetings of clubs or organizations: Not on file    Relationship status: Not on file  . Intimate partner violence    Fear of current or ex partner: No    Emotionally abused: No    Physically abused: No    Forced sexual activity: No  Other Topics Concern  . Not on file  Social History Narrative  .  Not on file    Family History  Problem Relation Age of Onset  . Heart disease Mother   . Asthma Father      Review of Systems  Constitutional: Negative.  Negative for chills and fever.  HENT: Negative.  Negative for congestion and sore throat.   Eyes: Negative.   Respiratory: Negative.  Negative for cough and shortness of breath.   Cardiovascular: Negative for chest pain and palpitations.  Gastrointestinal: Positive for abdominal pain.  Genitourinary: Negative.  Negative for dysuria and hematuria.  Musculoskeletal: Positive for back pain.  Skin: Negative.  Negative for rash.  Neurological: Negative.  Negative for dizziness, sensory change, focal weakness, loss of consciousness and headaches.  All other systems reviewed and are negative.    Vitals:   07/09/19 1019  BP: 115/64  Pulse: 84  Resp: 16  Temp: 98.3 F (36.8 C)  SpO2: 99%    Physical Exam Vitals signs reviewed.   Constitutional:      Appearance: He is well-developed.  HENT:     Head: Normocephalic.     Mouth/Throat:     Mouth: Mucous membranes are moist.     Pharynx: Oropharynx is clear.  Eyes:     Extraocular Movements: Extraocular movements intact.     Conjunctiva/sclera: Conjunctivae normal.     Pupils: Pupils are equal, round, and reactive to light.  Neck:     Musculoskeletal: Normal range of motion and neck supple.  Cardiovascular:     Rate and Rhythm: Normal rate and regular rhythm.     Pulses: Normal pulses.     Heart sounds: Normal heart sounds.  Pulmonary:     Effort: Pulmonary effort is normal.     Breath sounds: Normal breath sounds.  Abdominal:     General: Abdomen is flat. Bowel sounds are normal.     Palpations: Abdomen is soft.     Tenderness: There is abdominal tenderness in the right upper quadrant and right lower quadrant. There is no right CVA tenderness or left CVA tenderness. Positive signs include McBurney's sign.     Hernia: No hernia is present.  Musculoskeletal: Normal range of motion.  Lymphadenopathy:     Cervical: No cervical adenopathy.  Skin:    General: Skin is warm and dry.     Capillary Refill: Capillary refill takes less than 2 seconds.  Neurological:     General: No focal deficit present.     Mental Status: He is alert and oriented to person, place, and time.  Psychiatric:        Mood and Affect: Mood normal.        Behavior: Behavior normal.    Results for orders placed or performed in visit on 07/09/19 (from the past 24 hour(s))  POCT urinalysis dipstick     Status: None   Collection Time: 07/09/19 10:37 AM  Result Value Ref Range   Color, UA yellow yellow   Clarity, UA clear clear   Glucose, UA negative negative mg/dL   Bilirubin, UA negative negative   Ketones, POC UA negative negative mg/dL   Spec Grav, UA 5.681 2.751 - 1.025   Blood, UA negative negative   pH, UA 7.0 5.0 - 8.0   Protein Ur, POC negative negative mg/dL    Urobilinogen, UA 0.2 0.2 or 1.0 E.U./dL   Nitrite, UA Negative Negative   Leukocytes, UA Negative Negative  POCT CBC     Status: Abnormal   Collection Time: 07/09/19 11:01 AM  Result Value Ref  Range   WBC 6.0 4.6 - 10.2 K/uL   Lymph, poc 1.2 0.6 - 3.4   POC LYMPH PERCENT 19.9 10 - 50 %L   MID (cbc) 0.2 0 - 0.9   POC MID % 3.3 0 - 12 %M   POC Granulocyte 4.6 2 - 6.9   Granulocyte percent 76.8 37 - 80 %G   RBC 4.65 (A) 4.69 - 6.13 M/uL   Hemoglobin 14.2 11 - 14.6 g/dL   HCT, POC 41.4 (A) 29 - 41 %   MCV 89.0 76 - 111 fL   MCH, POC 30.5 27 - 31.2 pg   MCHC 34.3 31.8 - 35.4 g/dL   RDW, POC 13.4 %   Platelet Count, POC 295 142 - 424 K/uL   MPV 7.7 0 - 99.8 fL   A total of 40 minutes was spent in the room with the patient, greater than 50% of which was in counseling/coordination of care regarding differential diagnosis including acute appendicitis, blood results, urine results, x-ray results, treatment and management, and need for further evaluation in the emergency department including CT scan of abdomen and pelvis.   ASSESSMENT & PLAN: Gaje was seen today for abdominal pain and back pain.  Diagnoses and all orders for this visit:  Generalized abdominal pain Comments: Suspected acute appendicitis Orders: -     POCT CBC -     Comprehensive metabolic panel -     DG ABD ACUTE 2+V W 1V CHEST; Future  Back pain, unspecified back location, unspecified back pain laterality, unspecified chronicity -     POCT urinalysis dipstick    Patient Instructions    Go to emergency room at [facility] now for further evaluation and treatment. Suspected acute appendicitis. Abdominal Pain, Adult  Many things can cause belly (abdominal) pain. Most times, belly pain is not dangerous. Many cases of belly pain can be watched and treated at home. Sometimes belly pain is serious, though. Your doctor will try to find the cause of your belly pain. Follow these instructions at home:   Take over-the-counter and prescription medicines only as told by your doctor. Do not take medicines that help you poop (laxatives) unless told to by your doctor.  Drink enough fluid to keep your pee (urine) clear or pale yellow.  Watch your belly pain for any changes.  Keep all follow-up visits as told by your doctor. This is important. Contact a doctor if:  Your belly pain changes or gets worse.  You are not hungry, or you lose weight without trying.  You are having trouble pooping (constipated) or have watery poop (diarrhea) for more than 2-3 days.  You have pain when you pee or poop.  Your belly pain wakes you up at night.  Your pain gets worse with meals, after eating, or with certain foods.  You are throwing up and cannot keep anything down.  You have a fever. Get help right away if:  Your pain does not go away as soon as your doctor says it should.  You cannot stop throwing up.  Your pain is only in areas of your belly, such as the right side or the left lower part of the belly.  You have bloody or black poop, or poop that looks like tar.  You have very bad pain, cramping, or bloating in your belly.  You have signs of not having enough fluid or water in your body (dehydration), such as: ? Dark pee, very little pee, or no pee. ?  Cracked lips. ? Dry mouth. ? Sunken eyes. ? Sleepiness. ? Weakness. This information is not intended to replace advice given to you by your health care provider. Make sure you discuss any questions you have with your health care provider. Document Released: 03/05/2008 Document Revised: 04/06/2016 Document Reviewed: 02/29/2016 Elsevier Interactive Patient Education  [employer].    If you have lab work done today you will be contacted with your lab results within the next 2 weeks.  If you have not heard from [place] then please contact us. The fastest way to get your results is to register for My Chart.   IF you received an x-ray  today, you will receive an invoice from [facility]. Please contact [facility] at [phone] with questions or concerns regarding your invoice.   IF you received labwork today, you will receive an invoice from [facility]. Please contact [facility] at [phone] with questions or concerns regarding your invoice.   Our billing staff will not be able to assist you with questions regarding bills from these companies.  You will be contacted with the lab results as soon as they are available. The fastest way to get your results is to activate your My Chart account. Instructions are located on the last page of this paperwork. If you have not heard from [place] regarding the results in 2 weeks, please contact this office.         Edwina Barth, MD [facility]

## 2019-07-09 NOTE — Op Note (Signed)
07/09/2019  8:55 PM  PATIENT:  Taylor Baker  28 y.o. male  PRE-OPERATIVE DIAGNOSIS:  Acute appendicitis  POST-OPERATIVE DIAGNOSIS:  acute appendicitis  PROCEDURE:  Procedure(s): APPENDECTOMY LAPAROSCOPIC (N/A)  SURGEON:  Surgeon(s) and Role:    * Griselda Miner, MD - Primary  PHYSICIAN ASSISTANT:   ASSISTANTS: none   ANESTHESIA:   local and general  EBL:  10 mL   BLOOD ADMINISTERED:none  DRAINS: none   LOCAL MEDICATIONS USED:  MARCAINE     SPECIMEN:  Source of Specimen:  appendix  DISPOSITION OF SPECIMEN:  PATHOLOGY  COUNTS:  YES  TOURNIQUET:  * No tourniquets in log *  DICTATION: .Dragon Dictation   After informed consent was obtained patient was brought to the operating room placed in the supine position on the operating room table. After adequate induction of general anesthesia the patient's abdomen was prepped with ChloraPrep, allowed to dry, and draped in usual sterile manner. The area below the umbilicus was infiltrated with quarter percent Marcaine. A small incision was made with a 15 blade knife. This incision was carried down through the subcutaneous tissue bluntly with a hemostat and Army-Nav[employer] retractors until the linea alba was identified. The linea alba was incised with a 15 blade knife. Each side was grasped Coker clamps and elevated anteriorly. The preperitoneal space was probed bluntly with a hemostat until the peritoneum was opened and access was gained to the abdominal cavity. A 0 Vicryl purse string stitch was placed in the fascia surrounding the opening. A Hassan cannula was placed through the opening and anchored in place with the previously placed Vicryl purse string stitch. The laparoscope was placed through the [facility] cannula. The abdomen was insufflated with carbon dioxide without difficulty. Next the suprapubic area was infiltrated with quarter percent Marcaine. A small incision was made with a 15 blade knife. A 5 mm port was placed bluntly  through this incision into the abdominal cavity. A site was then chosen between the 2 port for placement of a 5 mm port. The area was infiltrated with quarter percent Marcaine. A small stab incision was made with a 15 blade knife. A 5 mm port was placed bluntly through this incision and the abdominal cavity under direct vision. The laparoscope was then moved to the suprapubic port. Using a Glassman grasper and harmonic scalpel the right lower quadrant was inspected. The appendix was readily identified. The appendix was elevated anteriorly and the mesoappendix was taken down sharply with the harmonic scalpel. Once the base of the appendix where it joined the cecum was identified and cleared of any tissue then a laparoscopic GIA blue load 6 row stapler was placed through the [facility] cannula. The stapler was placed across the base of the appendix clamped and fired thereby dividing the base of the appendix between staple lines. A laparoscopic bag was then inserted through the [facility] cannula. The appendix was placed within the bag and the bag was sealed. There was some oozing of blood from the staple line which was controlled with clips. The distal small bowel was tethered to the pelvic sidewall inferior to the right colon. The abdomen was then irrigated with copious amounts of saline until the effluent was clear. No other abnormalities were noted. The appendix and bag were removed with the [facility] cannula through the infraumbilical port without difficulty. The fascial defect was closed with the previously placed Vicryl pursestring stitch as well as with another interrupted 0 Vicryl figure-of-eight stitch. The rest of the ports were  removed under direct vision and were found to be hemostatic. The gas was allowed to escape. The skin incisions were closed with interrupted 4-0 Monocryl subcuticular stitches. Dermabond dressings were applied. The patient tolerated the procedure well. At the end of the case all needle sponge  and instrument counts were correct. The patient was then awakened and taken to recovery in stable condition.  PLAN OF CARE: Admit for overnight observation  PATIENT DISPOSITION:  PACU - hemodynamically stable.   Delay start of Pharmacological VTE agent (>24hrs) due to surgical blood loss or risk of bleeding: no

## 2019-07-09 NOTE — Patient Instructions (Addendum)
  Go to emergency room at [facility] now for further evaluation and treatment. Suspected acute appendicitis. Abdominal Pain, Adult  Many things can cause belly (abdominal) pain. Most times, belly pain is not dangerous. Many cases of belly pain can be watched and treated at home. Sometimes belly pain is serious, though. Your doctor will try to find the cause of your belly pain. Follow these instructions at home:  Take over-the-counter and prescription medicines only as told by your doctor. Do not take medicines that help you poop (laxatives) unless told to by your doctor.  Drink enough fluid to keep your pee (urine) clear or pale yellow.  Watch your belly pain for any changes.  Keep all follow-up visits as told by your doctor. This is important. Contact a doctor if:  Your belly pain changes or gets worse.  You are not hungry, or you lose weight without trying.  You are having trouble pooping (constipated) or have watery poop (diarrhea) for more than 2-3 days.  You have pain when you pee or poop.  Your belly pain wakes you up at night.  Your pain gets worse with meals, after eating, or with certain foods.  You are throwing up and cannot keep anything down.  You have a fever. Get help right away if:  Your pain does not go away as soon as your doctor says it should.  You cannot stop throwing up.  Your pain is only in areas of your belly, such as the right side or the left lower part of the belly.  You have bloody or black poop, or poop that looks like tar.  You have very bad pain, cramping, or bloating in your belly.  You have signs of not having enough fluid or water in your body (dehydration), such as: ? Dark pee, very little pee, or no pee. ? Cracked lips. ? Dry mouth. ? Sunken eyes. ? Sleepiness. ? Weakness. This information is not intended to replace advice given to you by your health care provider. Make sure you discuss any questions you have with your  health care provider. Document Released: 03/05/2008 Document Revised: 04/06/2016 Document Reviewed: 02/29/2016 Elsevier Interactive Patient Education  [employer].    If you have lab work done today you will be contacted with your lab results within the next 2 weeks.  If you have not heard from [place] then please contact us. The fastest way to get your results is to register for My Chart.   IF you received an x-ray today, you will receive an invoice from [facility]. Please contact [facility] at [phone] with questions or concerns regarding your invoice.   IF you received labwork today, you will receive an invoice from [facility]. Please contact [facility] at [phone] with questions or concerns regarding your invoice.   Our billing staff will not be able to assist you with questions regarding bills from these companies.  You will be contacted with the lab results as soon as they are available. The fastest way to get your results is to activate your My Chart account. Instructions are located on the last page of this paperwork. If you have not heard from [place] regarding the results in 2 weeks, please contact this office.

## 2019-07-09 NOTE — H&P (Signed)
Taylor Baker Taylor Baker March 21, 1991  [id number].    Requesting MD: Taylor GeraldsKelsey Ford, PA-C Chief Complaint/Reason for Consult: Acute Appendicitis   HPI: Taylor Baker is a 28 y.o. male with a history of asthma who presented to [facility] today for right lower quadrant abdominal pain and nausea.  Patient reports that yesterday afternoon he began having some vague, dull epigastric abdominal pain.  When he awoke this morning his pain had migrated to the right lower quadrant and was now sharp, more severe and constant with associated nausea. No associated fever, chills, emesis, diarrhea. Last BM this morning and normal. He went to his primary care provider who referred him to the ED for evaluation. In the ED patient was afebrile.  WBC 7.2.  CT of the abdomen pelvis revealed uncomplicated acute appendicitis without perforation or abscess.  General surgery was consulted for admission.  Patient's wife, Hospital doctorAmber at bedside.  Patient works for [employer].  He reports he drinks approximately 1-2 days/week.  No drug use or tobacco use.  He is not any blood thinners.  No previous abdominal surgeries.  ROS: Review of Systems  Constitutional: Negative for chills and fever.  Respiratory: Negative for cough and shortness of breath.   Cardiovascular: Negative for chest pain and leg swelling.  Gastrointestinal: Positive for abdominal pain and nausea. Negative for blood in stool, constipation, diarrhea and vomiting.  Genitourinary: Negative for dysuria.  All other systems reviewed and are negative.   Family History  Problem Relation Age of Onset  . Heart disease Mother   . Asthma Father     Past Medical History:  Diagnosis Date  . Allergy   . Anxiety   . Asthma     Past Surgical History:  Procedure Laterality Date  . LITHOTRIPSY  2003  . WRIST SURGERY Right 2016    Social History:  reports that he has never smoked. He has never used smokeless tobacco. He reports current alcohol use of about 7.0 -  10.0 standard drinks of alcohol per week. He reports that he does not use drugs.  Allergies: No Known Allergies  (Not in a hospital admission)    Physical Exam: Blood pressure 122/75, pulse 72, temperature 99.1 F (37.3 C), temperature source Oral, resp. rate 18, SpO2 100 %. General: pleasant, WD, WN white male who is laying in bed in NAD HEENT: head is normocephalic, atraumatic.  Sclera are noninjected.  PERRL.  Ears and nose without any masses or lesions.  Mouth is pink and moist Heart: regular, rate, and rhythm.  Normal s1,s2. No obvious murmurs, gallops, or rubs noted.  Palpable radial and pedal pulses bilaterally Lungs: CTAB, no wheezes, rhonchi, or rales noted.  Respiratory effort nonlabored Abd: soft, ND, tenderness over the right lower quadrant with voluntary guarding. No peritonitis. +BS, no masses, hernias, or organomegaly. No previous abdominal surgical scars.  MS: all 4 extremities are symmetrical with no cyanosis, clubbing, or edema. Skin: warm and dry with no masses, lesions, or rashes Neuro: A&Ox3, moves all extremities Psych: Alert with an appropriate affect.   Results for orders placed or performed during the hospital encounter of 07/09/19 (from the past 48 hour(s))  Comprehensive metabolic panel     Status: Abnormal   Collection Time: 07/09/19  1:05 PM  Result Value Ref Range   Sodium 140 135 - 145 mmol/L   Potassium 3.9 3.5 - 5.1 mmol/L   Chloride 105 98 - 111 mmol/L   CO2 25 22 - 32 mmol/L   Glucose,  Bld 103 (H) 70 - 99 mg/dL   BUN 6 6 - 20 mg/dL   Creatinine, Ser 6.26 0.61 - 1.24 mg/dL   Calcium 9.6 8.9 - 94.8 mg/dL   Total Protein 7.9 6.5 - 8.1 g/dL   Albumin 5.2 (H) 3.5 - 5.0 g/dL   AST 17 15 - 41 U/L   ALT 21 0 - 44 U/L   Alkaline Phosphatase 55 38 - 126 U/L   Total Bilirubin 0.6 0.3 - 1.2 mg/dL   GFR calc non Af Amer >60 >60 mL/min   GFR calc Af Amer >60 >60 mL/min   Anion gap 10 5 - 15    Comment: Performed at [facility], [address], [place], [place] [address]  Lipase, blood     Status: None   Collection Time: 07/09/19  1:05 PM  Result Value Ref Range   Lipase 29 11 - 51 U/L    Comment: Performed at [facility], [address] [place], [place] [address]  CBC with Differential     Status: None   Collection Time: 07/09/19  1:05 PM  Result Value Ref Range   WBC 7.2 4.0 - 10.5 K/uL   RBC 4.62 4.22 - 5.81 MIL/uL   Hemoglobin 13.8 13.0 - 17.0 g/dL   HCT 93.8 18.2 - 99.3 %   MCV 91.1 80.0 - 100.0 fL   MCH 29.9 26.0 - 34.0 pg   MCHC 32.8 30.0 - 36.0 g/dL   RDW 71.6 96.7 - 89.3 %   Platelets 293 150 - 400 K/uL   nRBC 0.0 0.0 - 0.2 %   Neutrophils Relative % 66 %   Neutro Abs 4.8 1.7 - 7.7 K/uL   Lymphocytes Relative 23 %   Lymphs Abs 1.6 0.7 - 4.0 K/uL   Monocytes Relative 8 %   Monocytes Absolute 0.6 0.1 - 1.0 K/uL   Eosinophils Relative 2 %   Eosinophils Absolute 0.1 0.0 - 0.5 K/uL   Basophils Relative 1 %   Basophils Absolute 0.0 0.0 - 0.1 K/uL   Immature Granulocytes 0 %   Abs Immature Granulocytes 0.02 0.00 - 0.07 K/uL    Comment: Performed at [facility], [address] [place], [place] [address]   Ct Abdomen Pelvis W Contrast  Result Date: 07/09/2019 CLINICAL DATA:  Patient with abdominal pain. Evaluate for appendicitis. EXAM: CT ABDOMEN AND PELVIS WITH CONTRAST TECHNIQUE: Multidetector CT imaging of the abdomen and pelvis was performed using the standard protocol following bolus administration of intravenous contrast. CONTRAST:  OMNIPAQUE IOHEXOL 300 MG/ML  SOLN COMPARISON:  None. FINDINGS: Lower chest: Normal heart size. Lung bases are clear. No pleural effusion. Hepatobiliary: Liver is normal in size and contour. Subcentimeter too small to characterize low-attenuation lesion hepatic dome (image 20; series 2). Gallbladder is unremarkable. No intrahepatic or extrahepatic biliary ductal dilatation. Pancreas: Unremarkable Spleen: Unremarkable  Adrenals/Urinary Tract: Normal adrenal glands. Kidneys enhance symmetrically with contrast. No hydronephrosis. Urinary bladder is unremarkable. Stomach/Bowel: The appendix is dilated measuring 10 mm (image 61; series 2) with periappendiceal fat stranding. No CT evidence to suggest appendiceal rupture. Normal morphology of the stomach. No evidence for small bowel obstruction. No free fluid or free intraperitoneal air. Vascular/Lymphatic: Normal caliber abdominal aorta. No retroperitoneal lymphadenopathy. Prominent right lower quadrant mesenteric nodes, likely reactive. Reproductive: Unremarkable. Other: None. Musculoskeletal: No aggressive or acute appearing osseous lesions. IMPRESSION: 1. Findings compatible with acute non perforated appendicitis. Electronically Signed   By: Annia Belt  M.D.   On: 07/09/2019 15:59   Dg Abd Acute 2+v W 1v Chest  Result Date: 07/09/2019 CLINICAL DATA:  Generalized abdominal pain. EXAM: DG ABDOMEN ACUTE W/ 1V CHEST COMPARISON:  Chest x-ray dated 01/26/2010 FINDINGS: There is no evidence of dilated bowel loops or free intraperitoneal air. No radiopaque calculi or other significant radiographic abnormality is seen. Heart size and mediastinal contours are within normal limits. Both lungs are clear. IMPRESSION: Negative abdominal radiographs.  No acute cardiopulmonary disease. Electronically Signed   By: Lorriane Shire M.D.   On: 07/09/2019 11:10   Anti-infectives (From admission, onward)   Start     Dose/Rate Route Frequency Ordered Stop   07/09/19 1615  cefTRIAXone (ROCEPHIN) 2 g in sodium chloride 0.9 % 100 mL IVPB     2 g 200 mL/hr over 30 Minutes Intravenous  Once 07/09/19 1607     07/09/19 1615  metroNIDAZOLE (FLAGYL) IVPB 500 mg     500 mg 100 mL/hr over 60 Minutes Intravenous  Once 07/09/19 1607         Assessment/Plan Acute Appendicitis - Will plan for Laparoscopic Appendectomy tonight, pending COVID test is negative. Will start on IV abx. I have discussed the  procedure and risks of appendectomy. The risks include but are not limited to bleeding, infection, wound problems, anesthesia, injury to intra-abdominal organs, possibility of postoperative ileus. He seems to understand and agrees with the plan. All questions by patient and patients wife were answered.  FEN - NPO VTE - SCDs ID - Rocephin/Flagyl   Jillyn Ledger, [facility] 07/09/2019, 4:45 PM Pager: [phone]

## 2019-07-10 ENCOUNTER — Encounter (HOSPITAL_COMMUNITY): Payer: Self-pay | Admitting: General Surgery

## 2019-07-10 LAB — HIV ANTIBODY (ROUTINE TESTING W REFLEX): HIV Screen 4th Generation wRfx: NONREACTIVE

## 2019-07-10 MED ORDER — OXYCODONE HCL 5 MG PO TABS: 5.0000 mg | ORAL_TABLET | Freq: Four times a day (QID) | ORAL | 0 refills | Status: AC | PRN

## 2019-07-10 NOTE — Plan of Care (Signed)
Patient discharged to home and transported via wheelchair. Instructions were reviewed and all questions were answered. No needs expressed.

## 2019-07-10 NOTE — Discharge Instructions (Signed)
[facility]  Please arrive at least 30 min before your appointment to complete your check in paperwork.  If you are unable to arrive 30 min prior to your appointment time we may have to cancel or reschedule you. LAPAROSCOPIC SURGERY: POST OP INSTRUCTIONS Always review your discharge instruction sheet given to you by the facility where your surgery was performed. IF YOU HAVE DISABILITY OR FAMILY LEAVE FORMS, YOU MUST BRING THEM TO THE OFFICE FOR PROCESSING.   DO NOT GIVE THEM TO YOUR DOCTOR.  PAIN CONTROL  1. First take acetaminophen (Tylenol) AND/or ibuprofen (Advil) to control your pain after surgery.  Follow directions on package.  Taking acetaminophen (Tylenol) and/or ibuprofen (Advil) regularly after surgery will help to control your pain and lower the amount of prescription pain medication you may need.  You should not take more than 4,000 mg (4 grams) of acetaminophen (Tylenol) in 24 hours.  You should not take ibuprofen (Advil), aleve, motrin, naprosyn or other NSAIDS if you have a history of stomach ulcers or chronic kidney disease.  2. A prescription for pain medication may be given to you upon discharge.  Take your pain medication as prescribed, if you still have uncontrolled pain after taking acetaminophen (Tylenol) or ibuprofen (Advil). 3. Use ice packs to help control pain. 4. If you need a refill on your pain medication, please contact your pharmacy.  They will contact our office to request authorization. Prescriptions will not be filled after 5pm or on week-ends.  HOME MEDICATIONS 5. Take your usually prescribed medications unless otherwise directed.  DIET 6. You should follow a light diet the first few days after arrival home.  Be sure to include lots of fluids daily. Avoid fatty, fried foods.   CONSTIPATION 7. It is common to experience some constipation after surgery and if you are taking pain medication.  Increasing fluid intake and taking a stool  softener (such as Colace) will usually help or prevent this problem from occurring.  A mild laxative (Milk of Magnesia or Miralax) should be taken according to package instructions if there are no bowel movements after 48 hours.  WOUND/INCISION CARE 8. Most patients will experience some swelling and bruising in the area of the incisions.  Ice packs will help.  Swelling and bruising can take several days to resolve.  9. Unless discharge instructions indicate otherwise, follow guidelines below  a. STERI-STRIPS - you may remove your outer bandages 48 hours after surgery, and you may shower at that time.  You have steri-strips (small skin tapes) in place directly over the incision.  These strips should be left on the skin for 7-10 days.   b. DERMABOND/SKIN GLUE - you may shower in 24 hours.  The glue will flake off over the next 2-3 weeks. 10. Any sutures or staples will be removed at the office during your follow-up visit.  ACTIVITIES 11. You may resume regular (light) daily activities beginning the next day--such as daily self-care, walking, climbing stairs--gradually increasing activities as tolerated.  You may have sexual intercourse when it is comfortable.  Refrain from any heavy lifting or straining until approved by your doctor. a. You may drive when you are no longer taking prescription pain medication, you can comfortably wear a seatbelt, and you can safely maneuver your car and apply brakes.  FOLLOW-UP 12. You should see your doctor in the office for a follow-up appointment approximately 2-3 weeks after your surgery.  You should have been given your post-op/follow-up appointment when   your surgery was scheduled.  If you did not receive a post-op/follow-up appointment, make sure that you call for this appointment within a day or two after you arrive home to insure a convenient appointment time.   WHEN TO CALL YOUR DOCTOR: 1. Fever over 101.0 2. Inability to urinate 3. Continued bleeding from  incision. 4. Increased pain, redness, or drainage from the incision. 5. Increasing abdominal pain  The clinic staff is available to answer your questions during regular business hours.  Please don't hesitate to call and ask to speak to one of the nurses for clinical concerns.  If you have a medical emergency, go to the nearest emergency room or call 911.  A surgeon from [facility] is always on call at the hospital. [address], [address], [place], [place]  [address] ? [address] [address], [place], [place]   [address] [phone] ? [phone] ? FAX [phone]  .........   Managing Your Pain After Surgery Without Opioids    Thank you for participating in our program to help patients manage their pain after surgery without opioids. This is part of our effort to provide you with the best care possible, without exposing you or your family to the risk that opioids pose.  What pain can I expect after surgery? You can expect to have some pain after surgery. This is normal. The pain is typically worse the day after surgery, and quickly begins to get better. Many studies have found that many patients are able to manage their pain after surgery with Over-the-Counter (OTC) medications such as Tylenol and Motrin. If you have a condition that does not allow you to take Tylenol or Motrin, notify your surgical team.  How will I manage my pain? The best strategy for controlling your pain after surgery is around the clock pain control with Tylenol (acetaminophen) and Motrin (ibuprofen or Advil). Alternating these medications with each other allows you to maximize your pain control. In addition to Tylenol and Motrin, you can use heating pads or ice packs on your incisions to help reduce your pain.  How will I alternate your regular strength over-the-counter pain medication? You will take a dose of pain medication every three hours. ; Start by taking 650 mg of Tylenol (2 pills of 325  mg) ; 3 hours later take 600 mg of Motrin (3 pills of 200 mg) ; 3 hours after taking the Motrin take 650 mg of Tylenol ; 3 hours after that take 600 mg of Motrin.   - 1 -  See example - if your first dose of Tylenol is at 12:00 PM   12:00 PM Tylenol 650 mg (2 pills of 325 mg)  3:00 PM Motrin 600 mg (3 pills of 200 mg)  6:00 PM Tylenol 650 mg (2 pills of 325 mg)  9:00 PM Motrin 600 mg (3 pills of 200 mg)  Continue alternating every 3 hours   We recommend that you follow this schedule around-the-clock for at least 3 days after surgery, or until you feel that it is no longer needed. Use the table on the last page of this handout to keep track of the medications you are taking. Important: Do not take more than 3000mg of Tylenol or 3200mg of Motrin in a 24-hour period. Do not take ibuprofen/Motrin if you have a history of bleeding stomach ulcers, severe kidney disease, &/or actively taking a blood thinner  What if I still have pain? If you have pain that is not   controlled with the over-the-counter pain medications (Tylenol and Motrin or Advil) you might have what we call "breakthrough" pain. You will receive a prescription for a small amount of an opioid pain medication such as Oxycodone, Tramadol, or Tylenol with Codeine. Use these opioid pills in the first 24 hours after surgery if you have breakthrough pain. Do not take more than 1 pill every 4-6 hours.  If you still have uncontrolled pain after using all opioid pills, don't hesitate to call our staff using the number provided. We will help make sure you are managing your pain in the best way possible, and if necessary, we can provide a prescription for additional pain medication.   Day 1    Time  Name of Medication Number of pills taken  Amount of Acetaminophen  Pain Level   Comments  AM PM       AM PM       AM PM       AM PM       AM PM       AM PM       AM PM       AM PM       Total Daily amount of Acetaminophen Do not  take more than  3,000 mg per day      Day 2    Time  Name of Medication Number of pills taken  Amount of Acetaminophen  Pain Level   Comments  AM PM       AM PM       AM PM       AM PM       AM PM       AM PM       AM PM       AM PM       Total Daily amount of Acetaminophen Do not take more than  3,000 mg per day      Day 3    Time  Name of Medication Number of pills taken  Amount of Acetaminophen  Pain Level   Comments  AM PM       AM PM       AM PM       AM PM          AM PM       AM PM       AM PM       AM PM       Total Daily amount of Acetaminophen Do not take more than  3,000 mg per day      Day 4    Time  Name of Medication Number of pills taken  Amount of Acetaminophen  Pain Level   Comments  AM PM       AM PM       AM PM       AM PM       AM PM       AM PM       AM PM       AM PM       Total Daily amount of Acetaminophen Do not take more than  3,000 mg per day      Day 5    Time  Name of Medication Number of pills taken  Amount of Acetaminophen  Pain Level   Comments  AM PM       AM PM       AM   PM       AM PM       AM PM       AM PM       AM PM       AM PM       Total Daily amount of Acetaminophen Do not take more than  3,000 mg per day       Day 6    Time  Name of Medication Number of pills taken  Amount of Acetaminophen  Pain Level  Comments  AM PM       AM PM       AM PM       AM PM       AM PM       AM PM       AM PM       AM PM       Total Daily amount of Acetaminophen Do not take more than  3,000 mg per day      Day 7    Time  Name of Medication Number of pills taken  Amount of Acetaminophen  Pain Level   Comments  AM PM       AM PM       AM PM       AM PM       AM PM       AM PM       AM PM       AM PM       Total Daily amount of Acetaminophen Do not take more than  3,000 mg per day        For additional information about how and where to safely dispose of unused  opioid medications - https://www.morepowerfulnc.org  Disclaimer: This document contains information and/or instructional materials adapted from [facility] for the typical patient with your condition. It does not replace medical advice from your health care provider because your experience may differ from that of the typical patient. Talk to your health care provider if you have any questions about this document, your condition or your treatment plan. Adapted from [facility]   

## 2019-07-10 NOTE — Discharge Summary (Signed)
     Patient ID: Taylor Baker [id number] [birth year] 28 y.o.  Admit date: 07/09/2019 Discharge date: 07/10/2019  Admitting Diagnosis: Acute appendicitis   Discharge Diagnosis Patient Active Problem List   Diagnosis Date Noted  . Acute appendicitis 07/09/2019    Consultants None   Reason for Admission: Taylor Baker is a 28 y.o. male with a history of asthma who presented to [facility] today for right lower quadrant abdominal pain and nausea.  Patient reports that yesterday afternoon he began having some vague, dull epigastric abdominal pain.  When he awoke this morning his pain had migrated to the right lower quadrant and was now sharp, more severe and constant with associated nausea. No associated fever, chills, emesis, diarrhea. Last BM this morning and normal. He went to his primary care provider who referred him to the ED for evaluation. In the ED patient was afebrile.  WBC 7.2.  CT of the abdomen pelvis revealed uncomplicated acute appendicitis without perforation or abscess.  General surgery was consulted for admission.  Patient's wife, Museum/gallery conservator at bedside.  Patient works for [employer].  He reports he drinks approximately 1-2 days/week.  No drug use or tobacco use.  He is not any blood thinners.  No previous abdominal surgeries.  Procedures Dr. Marlou Starks - 07/09/2019 - Laparoscopic Appendectomy   Hospital Course:  The patient was admitted and underwent a laparoscopic appendectomy.  The patient tolerated the procedure well.  On POD 1, the patient was tolerating a regular diet, voiding well, mobilizing, and pain was controlled with oral pain medications.  The patient was stable for DC home at this time with appropriate follow up made. He was provider a note for work at the time of discharge.     Physical Exam: Gen: Awake and alert, NAD Lungs: Normal rate and effort Abd: Soft, ND, approprietly tender, +BS, Incisions with glue intact appears well and are without drainage,  bleeding, or signs of infection  Allergies as of 07/10/2019   No Known Allergies     Medication List    STOP taking these medications   ibuprofen 200 MG tablet Commonly known as: ADVIL   meloxicam 7.5 MG tablet Commonly known as: MOBIC     TAKE these medications   multivitamin tablet Take 1 tablet by mouth daily.   oxyCODONE 5 MG immediate release tablet Commonly known as: Oxy IR/ROXICODONE Take 1 tablet (5 mg total) by mouth every 6 (six) hours as needed for breakthrough pain.   vitamin C 1000 MG tablet Take 1,000 mg by mouth daily.        [facility] Follow up.   Specialty: General Surgery Contact information: [address] [place] [phone]          Signed: Alferd Apa, [facility] 07/10/2019, 8:15 AM Pager: [phone]

## 2019-07-13 LAB — SURGICAL PATHOLOGY

## 2019-07-13 NOTE — Anesthesia Postprocedure Evaluation (Signed)
Anesthesia Post Note  Patient: Taylor Baker  Procedure(s) Performed: APPENDECTOMY LAPAROSCOPIC (N/A )     Patient location during evaluation: PACU Anesthesia Type: General Level of consciousness: awake and alert Pain management: pain level controlled Vital Signs Assessment: post-procedure vital signs reviewed and stable Respiratory status: spontaneous breathing, nonlabored ventilation, respiratory function stable and patient connected to nasal cannula oxygen Cardiovascular status: blood pressure returned to baseline and stable Postop Assessment: no apparent nausea or vomiting Anesthetic complications: no    Last Vitals:  Vitals:   07/10/19 0135 07/10/19 0545  BP: 124/75 114/71  Pulse: 96 75  Resp:  17  Temp: 37.1 C 36.9 C  SpO2: 95% 98%    Last Pain:  Vitals:   07/10/19 0613  TempSrc:   PainSc: [place]

## 2019-07-14 ENCOUNTER — Ambulatory Visit: Payer: Managed Care, Other (non HMO) | Admitting: Adult Health Nurse Practitioner

## 2021-02-10 ENCOUNTER — Encounter (HOSPITAL_COMMUNITY): Payer: Self-pay

## 2021-02-10 ENCOUNTER — Emergency Department (HOSPITAL_COMMUNITY)
Admission: EM | Admit: 2021-02-10 | Discharge: 2021-02-11 | Disposition: A | Discharge status: Home / Self Care | Payer: Managed Care, Other (non HMO) | Attending: Emergency Medicine | Admitting: Emergency Medicine

## 2021-02-10 ENCOUNTER — Other Ambulatory Visit: Payer: Self-pay

## 2021-02-10 DIAGNOSIS — Z5321 Procedure and treatment not carried out due to patient leaving prior to being seen by health care provider: Secondary | ICD-10-CM | POA: Insufficient documentation

## 2021-02-10 DIAGNOSIS — R519 Headache, unspecified: Secondary | ICD-10-CM | POA: Diagnosis present

## 2021-02-10 MED ORDER — ACETAMINOPHEN 500 MG PO TABS
1000.0000 mg | ORAL_TABLET | Freq: Once | ORAL | Status: AC
  Administered 2021-02-10: 1000 mg via ORAL
  Filled 2021-02-10: qty 2

## 2021-02-10 NOTE — ED Triage Notes (Signed)
Patient reports severe headache since 4 am, denies hx of migraines, states the pain is behind his eyes.

## 2021-02-10 NOTE — ED Notes (Signed)
Pt called x 3 , no answer

## 2021-02-10 NOTE — ED Notes (Signed)
Patient did not answer when called to come to exam room

## 2021-02-10 NOTE — ED Provider Notes (Signed)
Emergency Medicine Provider Triage Evaluation Note  Taylor Baker , a 30 y.o. male  was evaluated in triage.  Pt complains of headache.  Patient reports that his any gradually worsened since prior to coming to the emergency department.  Since leaving his home and coming to the [facility] apartment headache has gradually improved.  Patient reports that a band behind both of his eyes.  He endorses drinking 2 glasses of bourbon last night.  Patient reports that he has been under increased stress recently due to having his son in the hospital.  Patient denies any recent falls or injuries.    Review of Systems  Positive: Headache Negative: Visual disturbance, slurred speech, facial asymmetry, numbness, weakness,  Physical Exam  BP 125/74 (BP Location: Left Arm)   Pulse 84   Temp 99.2 F (37.3 C) (Oral)   Resp 16   SpO2 100%  Gen:   Awake, no distress   Resp:  Normal effort  MSK:   Moves extremities without difficulty  Other:  EOM Intact, pupils PERRL, equal grip strength  Medical Decision Making  Medically screening exam initiated at 8:25 PM.  Appropriate orders placed.  Taylor Baker was informed that the remainder of the evaluation will be completed by another provider, this initial triage assessment does not replace that evaluation, and the importance of remaining in the ED until their evaluation is complete.  Haskel Schroeder, PA-C 02/10/21 2028    Terrilee Files, MD 02/11/21 1233

## 2021-08-03 ENCOUNTER — Other Ambulatory Visit: Payer: Self-pay | Admitting: Gastroenterology

## 2021-08-03 DIAGNOSIS — R1011 Right upper quadrant pain: Secondary | ICD-10-CM

## 2021-08-15 ENCOUNTER — Ambulatory Visit
Admission: RE | Admit: 2021-08-15 | Discharge: 2021-08-15 | Disposition: A | Discharge status: Home / Self Care | Payer: Managed Care, Other (non HMO) | Source: Ambulatory Visit | Attending: Gastroenterology | Admitting: Gastroenterology

## 2021-08-15 DIAGNOSIS — R1011 Right upper quadrant pain: Secondary | ICD-10-CM

## 2021-08-21 ENCOUNTER — Other Ambulatory Visit: Payer: Self-pay | Admitting: Gastroenterology

## 2021-08-21 ENCOUNTER — Other Ambulatory Visit (HOSPITAL_COMMUNITY): Payer: Self-pay | Admitting: Gastroenterology

## 2021-08-21 DIAGNOSIS — R1011 Right upper quadrant pain: Secondary | ICD-10-CM

## 2021-08-31 ENCOUNTER — Ambulatory Visit (HOSPITAL_COMMUNITY)
Admission: RE | Admit: 2021-08-31 | Discharge: 2021-08-31 | Disposition: A | Discharge status: Home / Self Care | Payer: Managed Care, Other (non HMO) | Source: Ambulatory Visit | Attending: Gastroenterology | Admitting: Gastroenterology

## 2021-08-31 ENCOUNTER — Other Ambulatory Visit: Payer: Self-pay

## 2021-08-31 DIAGNOSIS — R1011 Right upper quadrant pain: Secondary | ICD-10-CM | POA: Insufficient documentation

## 2021-08-31 IMAGING — NM NM HEPATO W/GB/PHARM/[PERSON_NAME]
2 series · 12 of 12 positions shown · non-contrast
Comparison: Ultrasound abdomen done on [DATE]

CLINICAL DATA: Pain right upper quadrant

EXAM:
NUCLEAR MEDICINE HEPATOBILIARY IMAGING WITH GALLBLADDER EF
TECHNIQUE: Sequential images of the abdomen were obtained [DATE] minutes
following intravenous administration of radiopharmaceutical. After
oral ingestion of Ensure, gallbladder ejection fraction was
determined. At 60 min, normal ejection fraction is greater than 33%.
RADIOPHARMACEUTICALS:  5.2 mCi [Z0]  Choletec IV

[Series 1: gbef ensure · 3.28mm/px · 6 of 60 frames shown]
[frame 6/60]
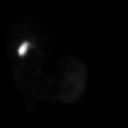
[frame 16/60]
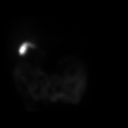
[frame 26/60]
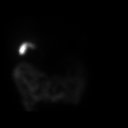
[frame 36/60]
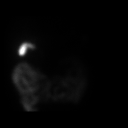
[frame 46/60]
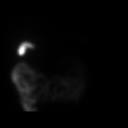
[frame 56/60]
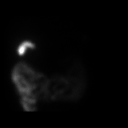

[Series 1: hida scan · 3.28mm/px · 6 of 60 frames shown]
[frame 6/60]
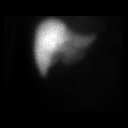
[frame 16/60]
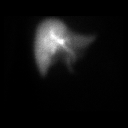
[frame 26/60]
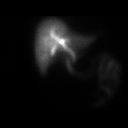
[frame 36/60]
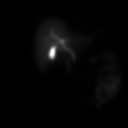
[frame 46/60]
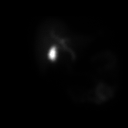
[frame 56/60]
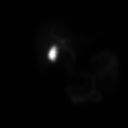

[12 of 12 positions shown; findings below may reference images not displayed]

FINDINGS: Prompt uptake and biliary excretion of activity by the liver is
seen. Gallbladder activity is visualized, consistent with patency of
cystic duct. Biliary activity passes into small bowel, consistent
with patent common bile duct.

Calculated gallbladder ejection fraction is 38%. (Normal gallbladder
ejection fraction with Ensure is greater than 33%.)
IMPRESSION: There is no evidence obstruction of cystic duct or common bile duct.
Estimated gallbladder ejection fraction is 38%.

## 2021-08-31 MED ORDER — TECHNETIUM TC 99M MEBROFENIN IV KIT
5.2000 | PACK | Freq: Once | INTRAVENOUS | Status: AC
  Administered 2021-08-31: 5.2 via INTRAVENOUS
